# Patient Record
Sex: Male | Born: 1960 | Race: White | Hispanic: No | Smoking: Former smoker
Health system: Southern US, Community
[De-identification: ages and names within clinical notes are randomized; demographics above are authoritative.]

## PROBLEM LIST (undated history)

## (undated) DIAGNOSIS — M1990 Unspecified osteoarthritis, unspecified site: Secondary | ICD-10-CM

## (undated) DIAGNOSIS — K219 Gastro-esophageal reflux disease without esophagitis: Secondary | ICD-10-CM

## (undated) DIAGNOSIS — Z9889 Other specified postprocedural states: Secondary | ICD-10-CM

## (undated) DIAGNOSIS — R112 Nausea with vomiting, unspecified: Secondary | ICD-10-CM

## (undated) HISTORY — PX: TONSILLECTOMY: SUR1361

## (undated) HISTORY — PX: SHOULDER ARTHROSCOPY: SHX128

## (undated) HISTORY — PX: CERVICAL FUSION: SHX112

---

## 1991-01-13 HISTORY — PX: KNEE ARTHROSCOPY: SUR90

## 2000-02-15 ENCOUNTER — Emergency Department (HOSPITAL_COMMUNITY): Admission: EM | Admit: 2000-02-15 | Discharge: 2000-02-15 | Payer: Self-pay | Admitting: Emergency Medicine

## 2000-02-25 ENCOUNTER — Encounter: Payer: Self-pay | Admitting: Specialist

## 2000-02-25 ENCOUNTER — Ambulatory Visit (HOSPITAL_COMMUNITY): Admission: RE | Admit: 2000-02-25 | Discharge: 2000-02-25 | Payer: Self-pay | Admitting: Specialist

## 2000-04-05 ENCOUNTER — Ambulatory Visit (HOSPITAL_COMMUNITY): Admission: RE | Admit: 2000-04-05 | Discharge: 2000-04-05 | Payer: Self-pay | Admitting: Specialist

## 2000-04-05 ENCOUNTER — Encounter: Payer: Self-pay | Admitting: Specialist

## 2000-05-27 ENCOUNTER — Encounter: Payer: Self-pay | Admitting: Specialist

## 2000-05-27 ENCOUNTER — Ambulatory Visit (HOSPITAL_COMMUNITY): Admission: RE | Admit: 2000-05-27 | Discharge: 2000-05-27 | Payer: Self-pay | Admitting: Specialist

## 2002-04-28 ENCOUNTER — Encounter: Payer: Self-pay | Admitting: Family Medicine

## 2002-04-28 ENCOUNTER — Encounter: Admission: RE | Admit: 2002-04-28 | Discharge: 2002-04-28 | Payer: Self-pay | Admitting: Family Medicine

## 2002-08-15 ENCOUNTER — Encounter: Payer: Self-pay | Admitting: Orthopedic Surgery

## 2002-08-15 ENCOUNTER — Ambulatory Visit (HOSPITAL_COMMUNITY): Admission: RE | Admit: 2002-08-15 | Discharge: 2002-08-15 | Payer: Self-pay | Admitting: Orthopedic Surgery

## 2008-12-12 HISTORY — PX: MEDIAL COLLATERAL LIGAMENT REPAIR, ELBOW: SHX2018

## 2011-01-07 ENCOUNTER — Encounter (HOSPITAL_BASED_OUTPATIENT_CLINIC_OR_DEPARTMENT_OTHER): Payer: Self-pay | Admitting: *Deleted

## 2011-01-07 NOTE — Progress Notes (Signed)
To [facility] at 1100.Hg,Ekg on arrival ,npo after mn-to take nexium w/sip water that am.

## 2011-01-08 ENCOUNTER — Encounter (HOSPITAL_BASED_OUTPATIENT_CLINIC_OR_DEPARTMENT_OTHER): Payer: Self-pay | Admitting: Anesthesiology

## 2011-01-08 ENCOUNTER — Ambulatory Visit (HOSPITAL_BASED_OUTPATIENT_CLINIC_OR_DEPARTMENT_OTHER)
Admission: RE | Admit: 2011-01-08 | Discharge: 2011-01-08 | Disposition: A | Discharge status: Home / Self Care | Payer: BC Managed Care – PPO | Source: Ambulatory Visit | Attending: Orthopedic Surgery | Admitting: Orthopedic Surgery

## 2011-01-08 ENCOUNTER — Other Ambulatory Visit: Payer: Self-pay

## 2011-01-08 ENCOUNTER — Other Ambulatory Visit: Payer: Self-pay | Admitting: Orthopedic Surgery

## 2011-01-08 ENCOUNTER — Encounter (HOSPITAL_BASED_OUTPATIENT_CLINIC_OR_DEPARTMENT_OTHER)
Admission: RE | Disposition: A | Discharge status: Home / Self Care | Payer: Self-pay | Source: Ambulatory Visit | Attending: Orthopedic Surgery

## 2011-01-08 ENCOUNTER — Encounter (HOSPITAL_BASED_OUTPATIENT_CLINIC_OR_DEPARTMENT_OTHER): Payer: Self-pay | Admitting: *Deleted

## 2011-01-08 ENCOUNTER — Ambulatory Visit (HOSPITAL_BASED_OUTPATIENT_CLINIC_OR_DEPARTMENT_OTHER): Payer: BC Managed Care – PPO | Admitting: Anesthesiology

## 2011-01-08 DIAGNOSIS — K219 Gastro-esophageal reflux disease without esophagitis: Secondary | ICD-10-CM | POA: Insufficient documentation

## 2011-01-08 DIAGNOSIS — Z9889 Other specified postprocedural states: Secondary | ICD-10-CM

## 2011-01-08 DIAGNOSIS — M7710 Lateral epicondylitis, unspecified elbow: Secondary | ICD-10-CM | POA: Insufficient documentation

## 2011-01-08 DIAGNOSIS — Z79899 Other long term (current) drug therapy: Secondary | ICD-10-CM | POA: Insufficient documentation

## 2011-01-08 HISTORY — DX: Nausea with vomiting, unspecified: Z98.890

## 2011-01-08 HISTORY — PX: REPAIR EXTENSOR TENDON: SHX5382

## 2011-01-08 HISTORY — DX: Gastro-esophageal reflux disease without esophagitis: K21.9

## 2011-01-08 HISTORY — DX: Other specified postprocedural states: R11.2

## 2011-01-08 SURGERY — REPAIR, TENDON, EXTENSOR
Anesthesia: General | Site: Elbow | Laterality: Right | Wound class: Clean

## 2011-01-08 MED ORDER — POVIDONE-IODINE 7.5 % EX SOLN: Freq: Once | CUTANEOUS | Status: DC

## 2011-01-08 MED ORDER — FENTANYL CITRATE 0.05 MG/ML IJ SOLN
INTRAMUSCULAR | Status: DC | PRN
  Administered 2011-01-08: 25 ug via INTRAVENOUS
  Administered 2011-01-08: 50 ug via INTRAVENOUS
  Administered 2011-01-08: 25 ug via INTRAVENOUS
  Administered 2011-01-08: 50 ug via INTRAVENOUS
  Administered 2011-01-08 (×2): 25 ug via INTRAVENOUS

## 2011-01-08 MED ORDER — LIDOCAINE HCL 1 % IJ SOLN
INTRAMUSCULAR | Status: DC | PRN
  Administered 2011-01-08: 14:00:00 via INTRAMUSCULAR

## 2011-01-08 MED ORDER — FENTANYL CITRATE 0.05 MG/ML IJ SOLN
25.0000 ug | INTRAMUSCULAR | Status: DC | PRN
  Administered 2011-01-08 (×2): 25 ug via INTRAVENOUS

## 2011-01-08 MED ORDER — MIDAZOLAM HCL 5 MG/5ML IJ SOLN
INTRAMUSCULAR | Status: DC | PRN
  Administered 2011-01-08: 2 mg via INTRAVENOUS

## 2011-01-08 MED ORDER — METOCLOPRAMIDE HCL 5 MG/ML IJ SOLN
INTRAMUSCULAR | Status: DC | PRN
  Administered 2011-01-08: 10 mg via INTRAVENOUS

## 2011-01-08 MED ORDER — LACTATED RINGERS IV SOLN
INTRAVENOUS | Status: DC
  Administered 2011-01-08: 100 mL/h via INTRAVENOUS
  Administered 2011-01-08: 14:00:00 via INTRAVENOUS

## 2011-01-08 MED ORDER — PROMETHAZINE HCL 25 MG/ML IJ SOLN: 6.2500 mg | INTRAMUSCULAR | Status: DC | PRN

## 2011-01-08 MED ORDER — ONDANSETRON HCL 4 MG/2ML IJ SOLN
INTRAMUSCULAR | Status: DC | PRN
  Administered 2011-01-08: 4 mg via INTRAVENOUS

## 2011-01-08 MED ORDER — LACTATED RINGERS IV SOLN
INTRAVENOUS | Status: DC
Start: 2011-01-08 — End: 2011-01-08

## 2011-01-08 MED ORDER — DEXAMETHASONE SODIUM PHOSPHATE 4 MG/ML IJ SOLN
INTRAMUSCULAR | Status: DC | PRN
  Administered 2011-01-08: 10 mg via INTRAVENOUS

## 2011-01-08 MED ORDER — PROPOFOL 10 MG/ML IV EMUL
INTRAVENOUS | Status: DC | PRN
  Administered 2011-01-08: 20 mg via INTRAVENOUS
  Administered 2011-01-08 (×2): 10 mg via INTRAVENOUS
  Administered 2011-01-08 (×2): 20 mg via INTRAVENOUS
  Administered 2011-01-08: 200 mg via INTRAVENOUS
  Administered 2011-01-08: 20 mg via INTRAVENOUS
  Administered 2011-01-08: 50 mg via INTRAVENOUS

## 2011-01-08 MED ORDER — MEPERIDINE HCL 50 MG PO TABS
50.0000 mg | ORAL_TABLET | ORAL | Status: DC | PRN
  Administered 2011-01-08: 50 mg via ORAL

## 2011-01-08 MED ORDER — KETOROLAC TROMETHAMINE 30 MG/ML IJ SOLN
INTRAMUSCULAR | Status: DC | PRN
  Administered 2011-01-08: 30 mg via INTRAVENOUS

## 2011-01-08 MED ORDER — GLYCOPYRROLATE 0.2 MG/ML IJ SOLN
INTRAMUSCULAR | Status: DC | PRN
  Administered 2011-01-08: 0.2 mg via INTRAVENOUS

## 2011-01-08 MED ORDER — LIDOCAINE HCL (CARDIAC) 20 MG/ML IV SOLN
INTRAVENOUS | Status: DC | PRN
  Administered 2011-01-08: 100 mg via INTRAVENOUS

## 2011-01-08 MED ORDER — DIPHENHYDRAMINE HCL 50 MG/ML IJ SOLN
INTRAMUSCULAR | Status: DC | PRN
  Administered 2011-01-08: 6.25 mg via INTRAVENOUS

## 2011-01-08 SURGICAL SUPPLY — 63 items
BANDAGE BSB183 OLD 31 155 4 (GAUZE/BANDAGES/DRESSINGS) IMPLANT
BANDAGE COBAN STERILE 4 (GAUZE/BANDAGES/DRESSINGS) ×2 IMPLANT
BANDAGE CONFORM 3  STR LF (GAUZE/BANDAGES/DRESSINGS) IMPLANT
BANDAGE ELASTIC 3 VELCRO ST LF (GAUZE/BANDAGES/DRESSINGS) IMPLANT
BANDAGE ELASTIC 6 VELCRO ST LF (GAUZE/BANDAGES/DRESSINGS) ×2 IMPLANT
BANDAGE GAUZE ELAST BULKY 4 IN (GAUZE/BANDAGES/DRESSINGS) ×2 IMPLANT
BLADE SURG 15 STRL LF DISP TIS (BLADE) ×2 IMPLANT
BLADE SURG 15 STRL SS (BLADE) ×2
BNDG ESMARK 4X9 LF (GAUZE/BANDAGES/DRESSINGS) ×2 IMPLANT
CANISTER SUCTION 1200CC (MISCELLANEOUS) IMPLANT
CANISTER SUCTION 2500CC (MISCELLANEOUS) ×2 IMPLANT
CLOTH BEACON ORANGE TIMEOUT ST (SAFETY) ×2 IMPLANT
CORDS BIPOLAR (ELECTRODE) ×2 IMPLANT
COVER TABLE BACK 60X90 (DRAPES) ×2 IMPLANT
DRAPE EXTREMITY T 121X128X90 (DRAPE) ×2 IMPLANT
DRAPE LG THREE QUARTER DISP (DRAPES) ×6 IMPLANT
DRAPE U-SHAPE 47X51 STRL (DRAPES) ×2 IMPLANT
DRSG EMULSION OIL 3X3 NADH (GAUZE/BANDAGES/DRESSINGS) ×2 IMPLANT
DRSG PAD ABDOMINAL 8X10 ST (GAUZE/BANDAGES/DRESSINGS) ×4 IMPLANT
DURAPREP 26ML APPLICATOR (WOUND CARE) ×4 IMPLANT
ELECT REM PT RETURN 9FT ADLT (ELECTROSURGICAL) ×2
ELECTRODE REM PT RTRN 9FT ADLT (ELECTROSURGICAL) ×1 IMPLANT
GAUZE KERLIX 2  STERILE LF (GAUZE/BANDAGES/DRESSINGS) ×2 IMPLANT
GAUZE SPONGE 4X4 12PLY STRL LF (GAUZE/BANDAGES/DRESSINGS) IMPLANT
GLOVE ECLIPSE 8.0 STRL XLNG CF (GLOVE) ×2 IMPLANT
GLOVE INDICATOR 6.5 STRL GRN (GLOVE) ×2 IMPLANT
GOWN W/COTTON TOWEL STD LRG (GOWNS) ×2 IMPLANT
GOWN XL W/COTTON TOWEL STD (GOWNS) ×2 IMPLANT
NEEDLE 27GAX1X1/2 (NEEDLE) IMPLANT
NEEDLE HYPO 22GX1.5 SAFETY (NEEDLE) ×2 IMPLANT
NS IRRIG 500ML POUR BTL (IV SOLUTION) ×2 IMPLANT
PACK BASIN DAY SURGERY FS (CUSTOM PROCEDURE TRAY) ×2 IMPLANT
PADDING CAST ABS 4INX4YD NS (CAST SUPPLIES) ×1
PADDING CAST ABS COTTON 4X4 ST (CAST SUPPLIES) ×1 IMPLANT
PADDING CAST COTTON 6X4 STRL (CAST SUPPLIES) ×2 IMPLANT
PENCIL BUTTON HOLSTER BLD 10FT (ELECTRODE) ×2 IMPLANT
SLING ARM FOAM STRAP LRG (SOFTGOODS) ×2 IMPLANT
SPLINT FAST PLASTER 5X30 (CAST SUPPLIES) ×15
SPLINT PLASTER CAST FAST 5X30 (CAST SUPPLIES) ×15 IMPLANT
SPONGE GAUZE 2X2 8PLY STRL LF (GAUZE/BANDAGES/DRESSINGS) IMPLANT
SPONGE GAUZE 4X4 12PLY (GAUZE/BANDAGES/DRESSINGS) ×2 IMPLANT
STOCKINETTE 4X48 STRL (DRAPES) IMPLANT
STOCKINETTE IMPERVIOUS LG (DRAPES) ×2 IMPLANT
SUCTION FRAZIER TIP 10 FR DISP (SUCTIONS) ×2 IMPLANT
SUT ETHILON 4 0 PS 2 18 (SUTURE) ×4 IMPLANT
SUT ETHILON 5 0 PS 2 18 (SUTURE) IMPLANT
SUT VIC AB 0 CT2 27 (SUTURE) ×2 IMPLANT
SUT VIC AB 0 SH 27 (SUTURE) ×4 IMPLANT
SUT VIC AB 2-0 SH 27 (SUTURE)
SUT VIC AB 2-0 SH 27XBRD (SUTURE) IMPLANT
SUT VIC AB 2-0 UR6 27 (SUTURE) IMPLANT
SUT VIC AB 3-0 PS2 18 (SUTURE)
SUT VIC AB 3-0 PS2 18XBRD (SUTURE) IMPLANT
SUT VIC AB 3-0 SH 27 (SUTURE) ×1
SUT VIC AB 3-0 SH 27X BRD (SUTURE) ×1 IMPLANT
SUT VIC AB 4-0 SH 27 (SUTURE)
SUT VIC AB 4-0 SH 27XANBCTRL (SUTURE) IMPLANT
SYR BULB IRRIGATION 50ML (SYRINGE) ×2 IMPLANT
SYR CONTROL 10ML LL (SYRINGE) IMPLANT
TOWEL OR 17X24 6PK STRL BLUE (TOWEL DISPOSABLE) ×4 IMPLANT
TUBE CONNECTING 12X1/4 (SUCTIONS) IMPLANT
UNDERPAD 30X30 INCONTINENT (UNDERPADS AND DIAPERS) ×2 IMPLANT
WATER STERILE IRR 500ML POUR (IV SOLUTION) ×2 IMPLANT

## 2011-01-08 NOTE — Anesthesia Preprocedure Evaluation (Addendum)
Anesthesia Evaluation  Patient identified by MRN, date of birth, ID band Patient awake    Reviewed: Allergy & Precautions, H&P , NPO status , Patient's Chart, lab work & pertinent test results, reviewed documented beta blocker date and time   History of Anesthesia Complications (+) PONV  Airway Mallampati: II TM Distance: >3 FB Neck ROM: full    Dental No notable dental hx. (+) Teeth Intact and Dental Advisory Given   Pulmonary neg pulmonary ROS,  clear to auscultation  Pulmonary exam normal       Cardiovascular Exercise Tolerance: Good neg cardio ROS regular Normal    Neuro/Psych Negative Neurological ROS  Negative Psych ROS   GI/Hepatic negative GI ROS, Neg liver ROS, GERD-  Medicated and Controlled,  Endo/Other  Negative Endocrine ROS  Renal/GU negative Renal ROS  Genitourinary negative   Musculoskeletal   Abdominal   Peds  Hematology negative hematology ROS (+)   Anesthesia Other Findings   Reproductive/Obstetrics negative OB ROS                          Anesthesia Physical Anesthesia Plan  ASA: II  Anesthesia Plan: General   Post-op Pain Management:    Induction: Intravenous  Airway Management Planned: LMA  Additional Equipment:   Intra-op Plan:   Post-operative Plan:   Informed Consent: I have reviewed the patients History and Physical, chart, labs and discussed the procedure including the risks, benefits and alternatives for the proposed anesthesia with the patient or authorized representative who has indicated his/her understanding and acceptance.   Dental Advisory Given  Plan Discussed with: CRNA and Surgeon  Anesthesia Plan Comments:         Anesthesia Quick Evaluation

## 2011-01-08 NOTE — Anesthesia Postprocedure Evaluation (Signed)
  Anesthesia Post-op Note  Patient: Derrick Martinez  Procedure(s) Performed:  REPAIR EXTENSOR TENDON - RECONSTRUCTION OF COMMON EXTENSOR TENDON WITH PARTIAL EPICONDYLECTOMY OF RIGHT ELBOW  Patient Location: PACU  Anesthesia Type: General  Level of Consciousness: oriented and sedated  Airway and Oxygen Therapy: Patient Spontanous Breathing  Post-op Pain: mild  Post-op Assessment: Post-op Vital signs reviewed, Patient's Cardiovascular Status Stable, Respiratory Function Stable and Patent Airway  Post-op Vital Signs: stable  Complications: No apparent anesthesia complications

## 2011-01-08 NOTE — Brief Op Note (Signed)
01/08/2011  2:33 PM  PATIENT:  Derrick Martinez  50 y.o. male  PRE-OPERATIVE DIAGNOSIS:  CHRONIC LATERAL EPICONDYLITIS RT ELBOW  POST-OPERATIVE DIAGNOSIS:  CHRONIC LATERAL EPICONDYLITIS RT ELBOW  PROCEDURE:  Procedure(s): REPAIR EXTENSOR TENDON with partial lateral epicondylectomy  SURGEON:  Surgeon(s): Ivor Kishi P Agam Davenport  PHYSICIAN ASSISTANT: none  ASSISTANTS: nurse  ANESTHESIA:   general  EBL:  Total I/O In: 1000 [I.V.:1000] Out: -   BLOOD ADMINISTERED:none  DRAINS: none   LOCAL MEDICATIONS USED:  MARCAINE *8**CC  SPECIMEN:  Source of Specimen:  granulation tissue from common extensor tendon  DISPOSITION OF SPECIMEN:  PATHOLOGY  COUNTS:  YES  TOURNIQUET:   Total Tourniquet Time Documented: Upper Arm (Right) - 65 minutes  DICTATION: .Other Dictation: Dictation Number [id number]  PLAN OF CARE: Discharge to home after PACU  PATIENT DISPOSITION:  PACU - hemodynamically stable.

## 2011-01-08 NOTE — H&P (Signed)
Derrick Martinez is an 50 y.o. male.   Chief Complaint: painful rt elbow HPI: chronic pain and tenderness over lateral epicondyle unresponsive to non-surgical treatment  Past Medical History  Diagnosis Date  . GERD (gastroesophageal reflux disease)     mild takes nexium every other day  . PONV (postoperative nausea and vomiting)     after knee surgery in '93    Past Surgical History  Procedure Date  . Medial collateral ligament repair, elbow 12/10  . Shoulder arthroscopy   . Knee arthroscopy 1993    History reviewed. No pertinent family history. Social History:  reports that he has quit smoking. His smoking use included Cigars. He does not have any smokeless tobacco history on file. He reports that he drinks alcohol. His drug history not on file.  Allergies: No Known Allergies  Medications Prior to Admission  Medication Dose Route Frequency Provider Last Rate Last Dose  . lactated ringers infusion   Intravenous Continuous Gaetano Hawthorne, MD 100 mL/hr at 01/08/11 1150 100 mL/hr at 01/08/11 1150  . povidone-iodine (BETADINE) 7.5 % scrub   Topical Once James P Aplington       Medications Prior to Admission  Medication Sig Dispense Refill  . diclofenac (VOLTAREN) 25 MG EC tablet Take 25 mg by mouth 2 (two) times daily. As needed       . esomeprazole (NEXIUM) 20 MG capsule Take 20 mg by mouth daily before breakfast. Takes every other day         Results for orders placed during the hospital encounter of 01/08/11 (from the past 48 hour(s))  POCT HEMOGLOBIN-HEMACUE     Status: Normal   Collection Time   01/08/11 11:55 AM      Component Value Range Comment   Hemoglobin 14.5  13.0 - 17.0 (g/dL)    No results found.  ROS  Blood pressure 125/74, pulse 75, temperature 96.9 F (36.1 C), temperature source Oral, resp. rate 18, height 6' (1.829 m), weight 90.719 kg (200 lb), SpO2 100.00%. Physical Exam  Constitutional: He is oriented to person, place, and time. He appears  well-developed and well-nourished.  HENT:  Head: Normocephalic and atraumatic.  Eyes: Conjunctivae and EOM are normal. Pupils are equal, round, and reactive to light.  Neck: Normal range of motion. Neck supple.  Cardiovascular: Normal rate, regular rhythm, normal heart sounds and intact distal pulses.   Respiratory: Effort normal and breath sounds normal.  GI: Soft. Bowel sounds are normal.  Musculoskeletal: He exhibits tenderness.       Tender lateral epicondyle rt elbow  Neurological: He is alert and oriented to person, place, and time. He has normal reflexes.  Skin: Skin is dry.  Psychiatric: He has a normal mood and affect. His behavior is normal. Judgment and thought content normal.     Assessment/Plan Chronic lateral epicondylitis rt elbow Repair common extensor tendon with partial lateral epicondylectomy rt elbow  APLINGTON,JAMES P 01/08/2011, 12:40 PM

## 2011-01-08 NOTE — Transfer of Care (Signed)
Immediate Anesthesia Transfer of Care Note  Patient: Derrick Martinez  Procedure(s) Performed:  REPAIR EXTENSOR TENDON - RECONSTRUCTION OF COMMON EXTENSOR TENDON WITH PARTIAL EPICONDYLECTOMY OF RIGHT ELBOW  Patient Location: PACU  Anesthesia Type: General  Level of Consciousness: awake, alert  and oriented  Airway & Oxygen Therapy: Patient Spontanous Breathing and Patient connected to face mask oxygen  Post-op Assessment: Report given to PACU RN and Post -op Vital signs reviewed and stable  Post vital signs: Reviewed and stable  Complications: No apparent anesthesia complications

## 2011-01-08 NOTE — Anesthesia Postprocedure Evaluation (Signed)
  Anesthesia Post-op Note  Patient: Derrick Martinez  Procedure(s) Performed:  REPAIR EXTENSOR TENDON - RECONSTRUCTION OF COMMON EXTENSOR TENDON WITH PARTIAL EPICONDYLECTOMY OF RIGHT ELBOW  Patient Location: PACU  Anesthesia Type: General  Level of Consciousness: oriented and sedated  Airway and Oxygen Therapy: Patient Spontanous Breathing  Post-op Pain: mild  Post-op Assessment: Post-op Vital signs reviewed, Patient's Cardiovascular Status Stable, Respiratory Function Stable and Patent Airway  Post-op Vital Signs: stable  Complications: No apparent anesthesia complications 

## 2011-01-08 NOTE — Anesthesia Procedure Notes (Signed)
Procedure Name: LMA Insertion Date/Time: 01/08/2011 12:57 PM Performed by: Huel Coventry Pre-anesthesia Checklist: Patient identified, Emergency Drugs available, Suction available and Patient being monitored Patient Re-evaluated:Patient Re-evaluated prior to inductionOxygen Delivery Method: Circle System Utilized Preoxygenation: Pre-oxygenation with 100% oxygen Intubation Type: IV induction Ventilation: Mask ventilation without difficulty LMA: LMA with gastric port inserted LMA Size: 5.0 Number of attempts: 1 Placement Confirmation: positive ETCO2 Tube secured with: Tape Dental Injury: Teeth and Oropharynx as per pre-operative assessment

## 2011-01-09 ENCOUNTER — Encounter (HOSPITAL_BASED_OUTPATIENT_CLINIC_OR_DEPARTMENT_OTHER): Payer: Self-pay | Admitting: Orthopedic Surgery

## 2011-01-09 NOTE — Op Note (Signed)
NAMEMURLIN, SCHRIEBER NO.:  [id number]  MEDICAL RECORD NO.:  [id number]  LOCATION:                               FACILITY:  [facility]  PHYSICIAN:  Marlowe Kays, M.D.  DATE OF BIRTH:  [birth year]  DATE OF PROCEDURE:  01/08/2011 DATE OF DISCHARGE:                              OPERATIVE REPORT   PREOPERATIVE DIAGNOSIS:  Chronic lateral epicondylitis, right elbow.  POSTOPERATIVE DIAGNOSIS:  Chronic lateral epicondylitis, right elbow.  OPERATION:  Reconstruction of common extensor tendon lateral right elbow with partial lateral epicondylectomy.  SURGEON:  Marlowe Kays, MD  ASSISTANT:  Nurse.  ANESTHESIA:  General.  PATHOLOGY AND JUSTIFICATION FOR PROCEDURE:  Chronic pain and tenderness over the lateral epicondyle of his right elbow, refractory to nonsurgical treatment.  PROCEDURE IN DETAIL:  Satisfied general anesthesia, pneumatic tourniquet, right upper extremity was prepped with DuraPrep from wrist to tourniquet, and draped as sterile field.  Tourniquet was inflated to 250 mmHg.  Time-out was performed.  I marked out incision midway between the lateral epicondyle and the olecranon and worked my way through the very thin fascia down to the common extensor tendon.  I outlined with a marking pin a horizontal U-type incision site to detach the common extensor tendon off the lateral epicondyle which I did with a combination of cautery and sharp dissection.  There was a typical appearance to the undersurface of the common extensor tendon with a glassy look to the tendon which was partially detached.  I detached the tendon all way down into the joint which appeared normal on inspection other than perhaps a little more joint fluid than I would have expected. I then debrided out the underneath surface of the common extensor tendon getting back to healthy tendon and sent the debridement portion of tissue to Pathology.  I then performed a partial lateral  epicondylectomy getting down to raw bone and on proximal end, made 2 parallel drill holes through the cut bone and then at the base of the residual epicondyle superiorly into these 2 holes, so that they connected.  We then took a 0-Vicryl suture through the top up of 2 holes, working from proximal and exiting out the cut bone surface winding it through the common extensor tendon and then back out through the bottom 2 holes. While the tendon was held in a snug position, I then re-approximated the sides of the U with additional 0 Vicryl after first irrigating the soft tissues with sterile saline and infiltrated them with 0.5% plain Marcaine.  At the most proximal end, I tied the suture that was in the bone and completed the reattachment of the tendon.  I  then closed the thin fascia with interrupted 3-0 Vicryl and interrupted 4-0 nylon mattress sutures in the skin and subcutaneous tissue.  Betadine, Adaptic, dry sterile dressing were applied followed by a long-arm splint cast which was well padded.  Tourniquet was released in the process, slightly over 1 hour of tourniquet time had been elapsed.  At the time of this dictation was on his way to Recovery in satisfactory condition with no known complications and essentially no blood loss.          ______________________________ Marlowe Kays,  M.D.     JA/MEDQ  D:  01/08/2011  T:  01/09/2011  Job:  [id number]

## 2011-10-16 ENCOUNTER — Other Ambulatory Visit: Payer: Self-pay | Admitting: Endocrinology

## 2011-10-16 DIAGNOSIS — E0590 Thyrotoxicosis, unspecified without thyrotoxic crisis or storm: Secondary | ICD-10-CM

## 2011-10-29 ENCOUNTER — Encounter (HOSPITAL_COMMUNITY)
Admission: RE | Admit: 2011-10-29 | Discharge: 2011-10-29 | Disposition: A | Discharge status: Home / Self Care | Payer: BC Managed Care – PPO | Source: Ambulatory Visit | Attending: Endocrinology | Admitting: Endocrinology

## 2011-10-29 DIAGNOSIS — E0590 Thyrotoxicosis, unspecified without thyrotoxic crisis or storm: Secondary | ICD-10-CM | POA: Insufficient documentation

## 2011-10-30 ENCOUNTER — Encounter (HOSPITAL_COMMUNITY)
Admission: RE | Admit: 2011-10-30 | Discharge: 2011-10-30 | Payer: BC Managed Care – PPO | Source: Ambulatory Visit | Attending: Endocrinology | Admitting: Endocrinology

## 2011-10-30 MED ORDER — SODIUM IODIDE I 131 CAPSULE
0.0100 | Freq: Once | INTRAVENOUS | Status: AC | PRN
  Administered 2011-10-30: 0.01 via ORAL

## 2011-11-26 ENCOUNTER — Other Ambulatory Visit (HOSPITAL_COMMUNITY): Payer: Self-pay

## 2012-08-23 ENCOUNTER — Other Ambulatory Visit: Payer: Self-pay | Admitting: Dermatology

## 2013-03-19 ENCOUNTER — Encounter (HOSPITAL_COMMUNITY): Payer: Self-pay | Admitting: Emergency Medicine

## 2013-03-19 ENCOUNTER — Emergency Department (HOSPITAL_COMMUNITY): Payer: BC Managed Care – PPO

## 2013-03-19 ENCOUNTER — Emergency Department (HOSPITAL_COMMUNITY)
Admission: EM | Admit: 2013-03-19 | Discharge: 2013-03-19 | Disposition: A | Discharge status: Home / Self Care | Payer: BC Managed Care – PPO | Source: Home / Self Care | Attending: Emergency Medicine | Admitting: Emergency Medicine

## 2013-03-19 ENCOUNTER — Emergency Department (HOSPITAL_COMMUNITY)
Admission: EM | Admit: 2013-03-19 | Discharge: 2013-03-19 | Disposition: A | Discharge status: Home / Self Care | Payer: BC Managed Care – PPO | Attending: Emergency Medicine | Admitting: Emergency Medicine

## 2013-03-19 DIAGNOSIS — M542 Cervicalgia: Secondary | ICD-10-CM

## 2013-03-19 DIAGNOSIS — S01309A Unspecified open wound of unspecified ear, initial encounter: Secondary | ICD-10-CM | POA: Insufficient documentation

## 2013-03-19 DIAGNOSIS — K219 Gastro-esophageal reflux disease without esophagitis: Secondary | ICD-10-CM | POA: Insufficient documentation

## 2013-03-19 DIAGNOSIS — Z79899 Other long term (current) drug therapy: Secondary | ICD-10-CM

## 2013-03-19 DIAGNOSIS — Y92410 Unspecified street and highway as the place of occurrence of the external cause: Secondary | ICD-10-CM

## 2013-03-19 DIAGNOSIS — Z23 Encounter for immunization: Secondary | ICD-10-CM | POA: Insufficient documentation

## 2013-03-19 DIAGNOSIS — S01319A Laceration without foreign body of unspecified ear, initial encounter: Secondary | ICD-10-CM

## 2013-03-19 DIAGNOSIS — Z87891 Personal history of nicotine dependence: Secondary | ICD-10-CM | POA: Insufficient documentation

## 2013-03-19 DIAGNOSIS — S01311A Laceration without foreign body of right ear, initial encounter: Secondary | ICD-10-CM

## 2013-03-19 DIAGNOSIS — Y9389 Activity, other specified: Secondary | ICD-10-CM | POA: Insufficient documentation

## 2013-03-19 DIAGNOSIS — S0990XA Unspecified injury of head, initial encounter: Secondary | ICD-10-CM | POA: Insufficient documentation

## 2013-03-19 MED ORDER — ONDANSETRON HCL 4 MG/2ML IJ SOLN
4.0000 mg | Freq: Once | INTRAMUSCULAR | Status: DC
  Filled 2013-03-19: qty 2

## 2013-03-19 MED ORDER — ONDANSETRON 4 MG PO TBDP
4.0000 mg | ORAL_TABLET | Freq: Once | ORAL | Status: AC
  Administered 2013-03-19: 4 mg via ORAL

## 2013-03-19 MED ORDER — CEPHALEXIN 250 MG PO CAPS
500.0000 mg | ORAL_CAPSULE | Freq: Once | ORAL | Status: AC
  Administered 2013-03-19: 500 mg via ORAL
  Filled 2013-03-19: qty 2

## 2013-03-19 MED ORDER — HYDROCODONE-ACETAMINOPHEN 5-325 MG PO TABS
2.0000 | ORAL_TABLET | Freq: Once | ORAL | Status: AC
  Administered 2013-03-19: 2 via ORAL
  Filled 2013-03-19: qty 2

## 2013-03-19 MED ORDER — AMOXICILLIN-POT CLAVULANATE 875-125 MG PO TABS: 1.0000 | ORAL_TABLET | Freq: Two times a day (BID) | ORAL | Status: DC

## 2013-03-19 MED ORDER — MORPHINE SULFATE 4 MG/ML IJ SOLN
4.0000 mg | Freq: Once | INTRAMUSCULAR | Status: DC
  Filled 2013-03-19: qty 1

## 2013-03-19 MED ORDER — TETANUS-DIPHTH-ACELL PERTUSSIS 5-2.5-18.5 LF-MCG/0.5 IM SUSP
0.5000 mL | Freq: Once | INTRAMUSCULAR | Status: AC
  Administered 2013-03-19: 0.5 mL via INTRAMUSCULAR
  Filled 2013-03-19: qty 0.5

## 2013-03-19 MED ORDER — ONDANSETRON 4 MG PO TBDP
ORAL_TABLET | ORAL | Status: AC
  Filled 2013-03-19: qty 1

## 2013-03-19 MED ORDER — HYDROCODONE-ACETAMINOPHEN 5-325 MG PO TABS: 2.0000 | ORAL_TABLET | ORAL | Status: DC | PRN
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
## 2013-03-19 NOTE — ED Notes (Signed)
Wound care, gave pt some 4x4, right ear continues to bleed
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
## 2013-03-19 NOTE — ED Notes (Signed)
Suture cart at the bedside.  

## 2013-03-19 NOTE — ED Notes (Signed)
Pt refuses pain medicine at this time  

## 2013-03-19 NOTE — ED Notes (Signed)
MD removed hard collar, pt denies tenderness to neck

## 2013-03-19 NOTE — Discharge Instructions (Signed)

## 2013-03-19 NOTE — ED Notes (Signed)
C-collar placed on patient.
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
## 2013-03-19 NOTE — ED Notes (Signed)
NS gauge applied to right ear, pt was getting nausea, MD aware orders given. Report given to [facility], Pt leaving with Family POV
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
## 2013-03-19 NOTE — ED Notes (Signed)
ENT at bedside
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
## 2013-03-19 NOTE — ED Notes (Signed)
Dr. Colbert CoyerArrieta at bedside, orders for pain med.  Pt declines at this time.  Nurse advised when pain med needed let nurse know.  Drip pad underneath right ear as lac is oozing blood.

## 2013-03-19 NOTE — Discharge Instructions (Signed)
Go directly to [facility] to be seen by Dr. Suszanne Connerseoh
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
## 2013-05-30 ENCOUNTER — Ambulatory Visit: Payer: BC Managed Care – PPO

## 2013-06-12 ENCOUNTER — Ambulatory Visit: Payer: BC Managed Care – PPO | Attending: Neurosurgery

## 2013-07-10 ENCOUNTER — Ambulatory Visit: Payer: BC Managed Care – PPO

## 2013-07-19 ENCOUNTER — Ambulatory Visit: Payer: BC Managed Care – PPO | Attending: Neurosurgery

## 2013-07-19 DIAGNOSIS — M2560 Stiffness of unspecified joint, not elsewhere classified: Secondary | ICD-10-CM | POA: Diagnosis not present

## 2013-07-19 DIAGNOSIS — Z981 Arthrodesis status: Secondary | ICD-10-CM | POA: Diagnosis not present

## 2013-07-19 DIAGNOSIS — M542 Cervicalgia: Secondary | ICD-10-CM | POA: Diagnosis not present

## 2013-07-19 DIAGNOSIS — IMO0001 Reserved for inherently not codable concepts without codable children: Secondary | ICD-10-CM | POA: Diagnosis present

## 2013-07-27 ENCOUNTER — Encounter: Payer: BC Managed Care – PPO | Admitting: Physical Therapy

## 2013-08-01 ENCOUNTER — Ambulatory Visit: Payer: BC Managed Care – PPO | Admitting: Physical Therapy

## 2013-08-03 ENCOUNTER — Ambulatory Visit: Payer: BC Managed Care – PPO | Admitting: Physical Therapy

## 2013-08-03 DIAGNOSIS — IMO0001 Reserved for inherently not codable concepts without codable children: Secondary | ICD-10-CM | POA: Diagnosis not present

## 2013-08-24 ENCOUNTER — Encounter: Payer: BC Managed Care – PPO | Admitting: Physical Therapy

## 2013-08-30 ENCOUNTER — Encounter: Payer: BC Managed Care – PPO | Admitting: Physical Therapy
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
## 2015-05-30 ENCOUNTER — Other Ambulatory Visit: Payer: Self-pay | Admitting: Gastroenterology

## 2015-05-30 DIAGNOSIS — R1011 Right upper quadrant pain: Secondary | ICD-10-CM

## 2015-05-30 DIAGNOSIS — R1032 Left lower quadrant pain: Secondary | ICD-10-CM

## 2015-06-07 ENCOUNTER — Ambulatory Visit
Admission: RE | Admit: 2015-06-07 | Discharge: 2015-06-07 | Disposition: A | Discharge status: Home / Self Care | Payer: BLUE CROSS/BLUE SHIELD | Source: Ambulatory Visit | Attending: Gastroenterology | Admitting: Gastroenterology

## 2015-06-07 DIAGNOSIS — R1011 Right upper quadrant pain: Secondary | ICD-10-CM

## 2015-06-07 DIAGNOSIS — R1032 Left lower quadrant pain: Secondary | ICD-10-CM

## 2015-06-12 ENCOUNTER — Other Ambulatory Visit (HOSPITAL_COMMUNITY): Payer: Self-pay | Admitting: Gastroenterology

## 2015-06-12 DIAGNOSIS — R1032 Left lower quadrant pain: Secondary | ICD-10-CM

## 2015-06-12 DIAGNOSIS — R1011 Right upper quadrant pain: Secondary | ICD-10-CM

## 2015-06-20 ENCOUNTER — Encounter (HOSPITAL_COMMUNITY): Payer: BLUE CROSS/BLUE SHIELD

## 2016-06-29 ENCOUNTER — Ambulatory Visit (HOSPITAL_COMMUNITY)
Admission: EM | Admit: 2016-06-29 | Discharge: 2016-06-29 | Disposition: A | Discharge status: Home / Self Care | Payer: BLUE CROSS/BLUE SHIELD | Attending: Family Medicine | Admitting: Family Medicine

## 2016-06-29 ENCOUNTER — Encounter (HOSPITAL_COMMUNITY): Payer: Self-pay | Admitting: *Deleted

## 2016-06-29 DIAGNOSIS — L03115 Cellulitis of right lower limb: Secondary | ICD-10-CM

## 2016-06-29 MED ORDER — DOXYCYCLINE HYCLATE 100 MG PO TABS: 100.0000 mg | ORAL_TABLET | Freq: Two times a day (BID) | ORAL | 0 refills | Status: DC

## 2016-06-29 NOTE — ED Triage Notes (Signed)
inj    r   Foot   4  Days     Ago       Pt   Stepped       In an oyster  Bed    Pt  Has  A  Break  In  Skin     Bleeding  Subsided       Pain    r    Foot    Getting  Better

## 2016-06-29 NOTE — ED Provider Notes (Addendum)
[facility]    CSN: [id number] Arrival date & time: 06/29/16  1300     History   Chief Complaint Chief Complaint  Patient presents with  . Foot Pain    HPI Derrick Martinez is a 56 y.o. male.   inj    r   Foot   4  Days     Ago       Pt   Stepped       In an oyster  Bed    Pt  Has  A  Break  In  Skin     Bleeding  Subsided       Pain    r    Foot    Getting  Better       This is a 56 year old man is [place] and suffered a laceration to the right foot, lateral aspect of the sole. He washed the laceration site out very well at the onset and seemed to be doing well until today when he noticed that the wound had opened, he was having more swelling, and the pain seemed to increase a little bit.  Last tetanus shot was 03/19/2013      Past Medical History:  Diagnosis Date  . GERD (gastroesophageal reflux disease)    mild takes nexium every other day  . PONV (postoperative nausea and vomiting)    after knee surgery in '93    There are no active problems to display for this patient.   Past Surgical History:  Procedure Laterality Date  . CERVICAL FUSION    . KNEE ARTHROSCOPY  1993  . MEDIAL COLLATERAL LIGAMENT REPAIR, ELBOW  12/10  . REPAIR EXTENSOR TENDON  01/08/2011   Procedure: REPAIR EXTENSOR TENDON;  Surgeon: Illene LabradorJames P Aplington;  Location: [facility];  Service: Orthopedics;  Laterality: Right;  RECONSTRUCTION OF COMMON EXTENSOR TENDON WITH PARTIAL EPICONDYLECTOMY OF RIGHT ELBOW  . SHOULDER ARTHROSCOPY         Home Medications    Prior to Admission medications   Medication Sig Start Date End Date Taking? Authorizing Provider  CIALIS 20 MG tablet Take 20 mg by mouth daily as needed. 03/02/13   [provider]  doxycycline (VIBRA-TABS) 100 MG tablet Take 1 tablet (100 mg total) by mouth 2 (two) times daily. 06/29/16   Elvina SidleLauenstein, Breylon Sherrow, MD  esomeprazole (NEXIUM) 20 MG capsule Take 20 mg  by mouth daily before breakfast. Takes every other day     [provider]    Family History Family History  Problem Relation Age of Onset  . Diabetes Father     Social History Social History  Substance Use Topics  . Smoking status: Former Smoker    Types: Cigars  . Smokeless tobacco: Never Used  . Alcohol use Yes     Comment: occasionally     Allergies   Percocet [oxycodone-acetaminophen]   Review of Systems Review of Systems  Skin: Positive for wound.  All other systems reviewed and are negative.    Physical Exam Triage Vital Signs ED Triage Vitals  Enc Vitals Group     BP 06/29/16 1313 130/76     Pulse Rate 06/29/16 1313 78     Resp 06/29/16 1313 18     Temp 06/29/16 1313 98.4 F (36.9 C)     Temp Source 06/29/16 1313 Oral     SpO2 06/29/16 1313 100 %     Weight --  Height --      Head Circumference --      Peak Flow --      Pain Score 06/29/16 1312 2     Pain Loc --      Pain Edu? --      Excl. in GC? --    No data found.   Updated Vital Signs BP 130/76 (BP Location: Right Arm)   Pulse 78   Temp 98.4 F (36.9 C) (Oral)   Resp 18   SpO2 100%    Physical Exam  Constitutional: He is oriented to person, place, and time. He appears well-developed and well-nourished.  HENT:  Head: Normocephalic.  Right Ear: External ear normal.  Left Ear: External ear normal.  Mouth/Throat: Oropharynx is clear and moist.  Eyes: Conjunctivae are normal. Pupils are equal, round, and reactive to light.  Neck: Normal range of motion.  Pulmonary/Chest: Effort normal.  Musculoskeletal: Normal range of motion.  Neurological: He is alert and oriented to person, place, and time.  Skin: Skin is warm and dry. There is erythema.  Mild swelling, laterally, right foot, sole with well approximated laceration and no drainage  Nursing note and vitals reviewed.    UC Treatments / Results  Labs (all labs ordered are listed, but only abnormal results are  displayed) Labs Reviewed - No data to display  EKG  EKG Interpretation None       Radiology No results found.  Procedures Procedures (including critical care time)  Medications Ordered in UC Medications - No data to display   Initial Impression / Assessment and Plan / UC Course  I have reviewed the triage vital signs and the nursing notes.  Pertinent labs & imaging results that were available during my care of the patient were reviewed by me and considered in my medical decision making (see chart for details).     Final Clinical Impressions(s) / UC Diagnoses   Final diagnoses:  Cellulitis of right lower extremity    New Prescriptions New Prescriptions   DOXYCYCLINE (VIBRA-TABS) 100 MG TABLET    Take 1 tablet (100 mg total) by mouth 2 (two) times daily.     Elvina Sidle, MD 06/29/16 1339    Elvina Sidle, MD 06/29/16 1339

## 2017-02-22 IMAGING — US US ABDOMEN COMPLETE
1 series · 14 of 25 positions shown · non-contrast
Comparison: CT 04/30/2015

CLINICAL DATA: Right upper quadrant pain and left lower quadrant
pain.

EXAM:
ABDOMEN ULTRASOUND COMPLETE

[Series 1: us abdomen complete · 0.32mm/px · 14 of 90 slices shown]
[im 1/90]
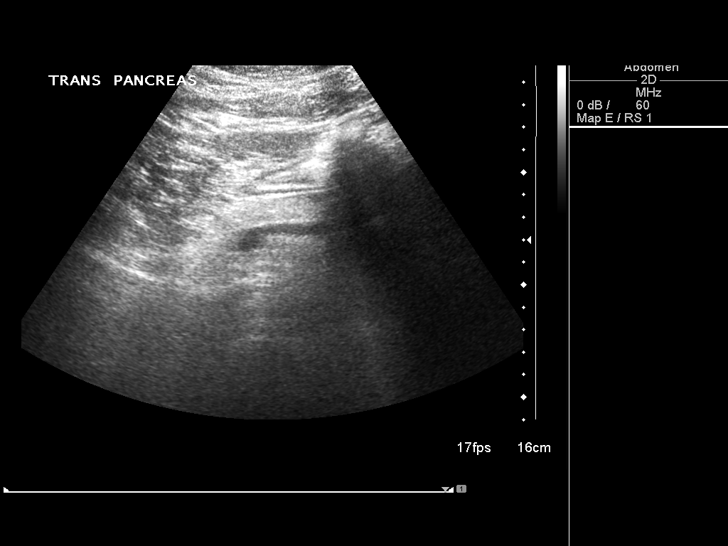
[im 8/90]
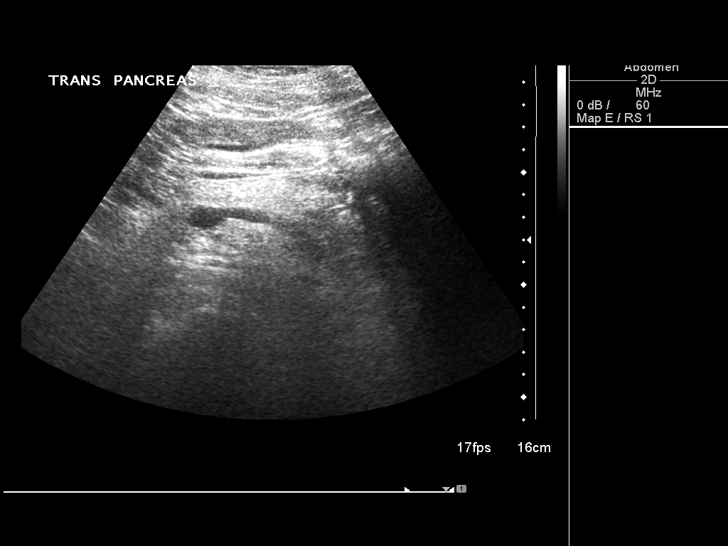
[im 15/90]
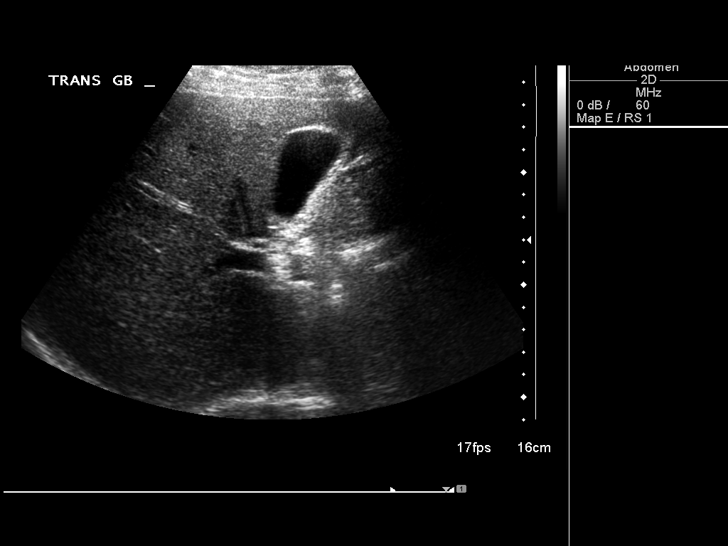
[im 23/90]
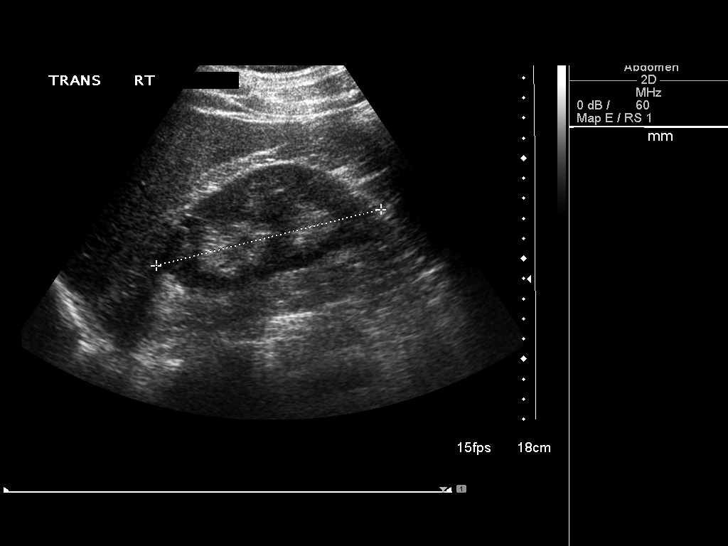
[im 30/90]
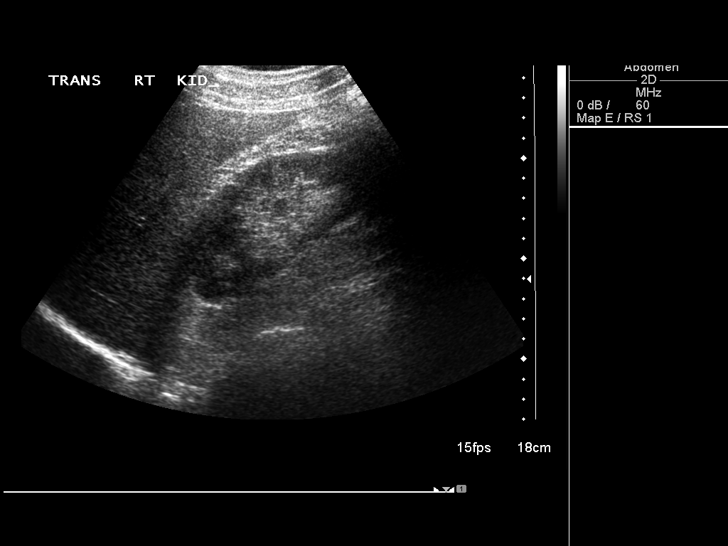
[im 34/90]
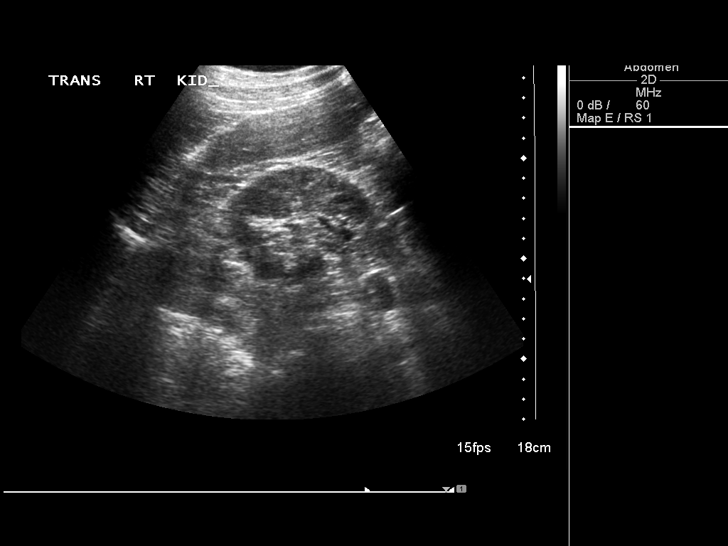
[im 41/90]
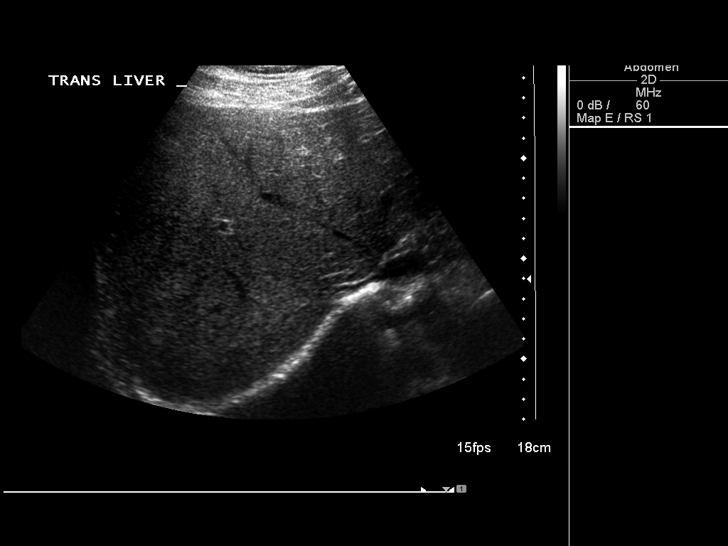
[im 49/90]
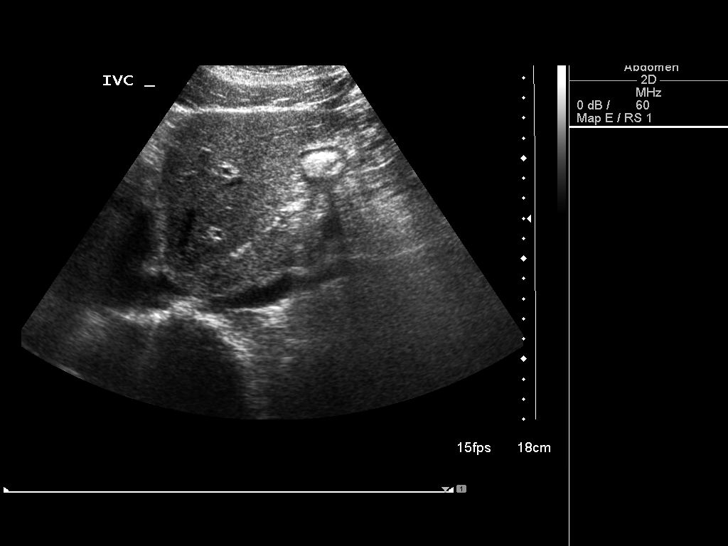
[im 56/90]
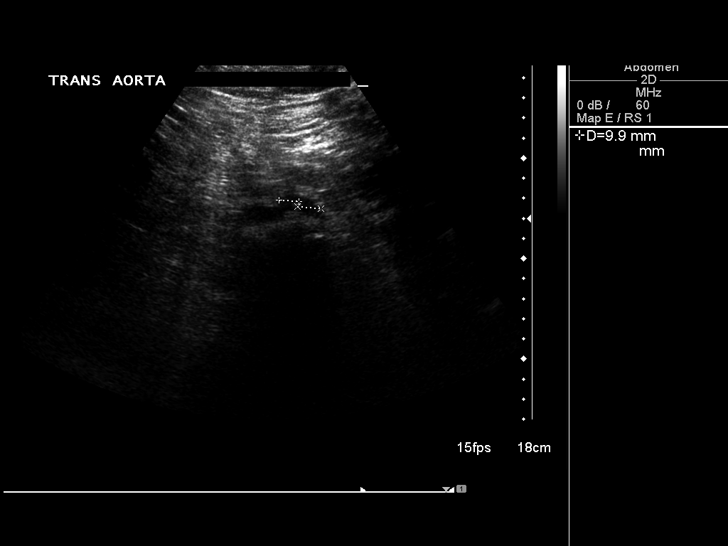
[im 60/90]
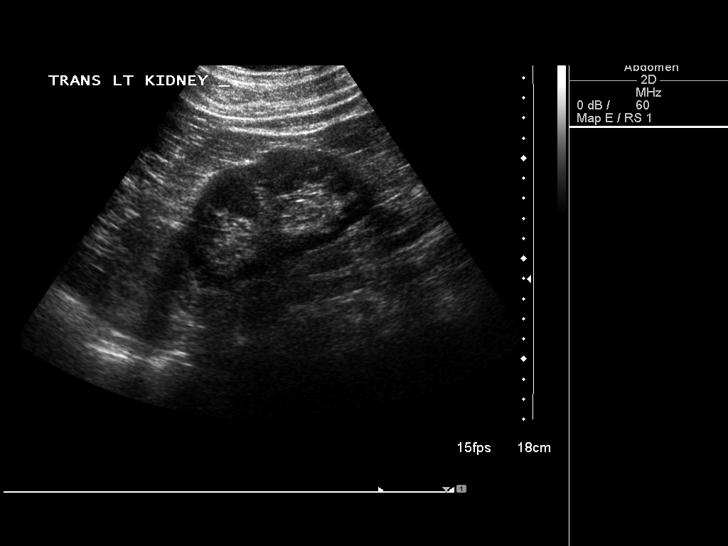
[im 67/90]
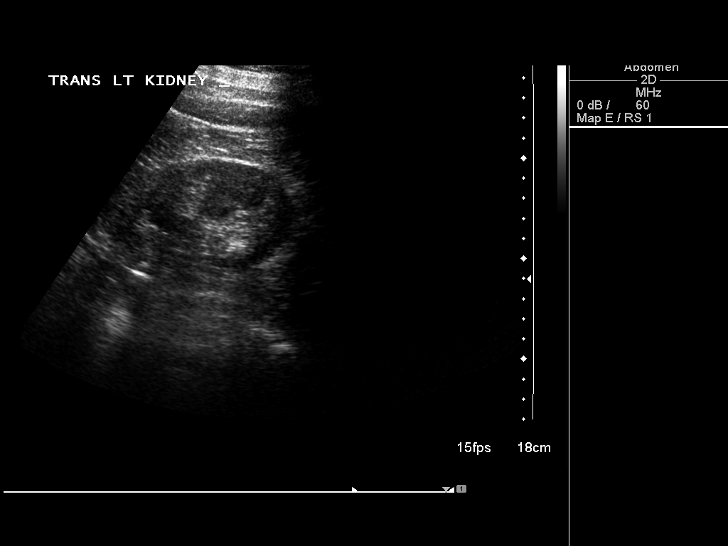
[im 75/90]
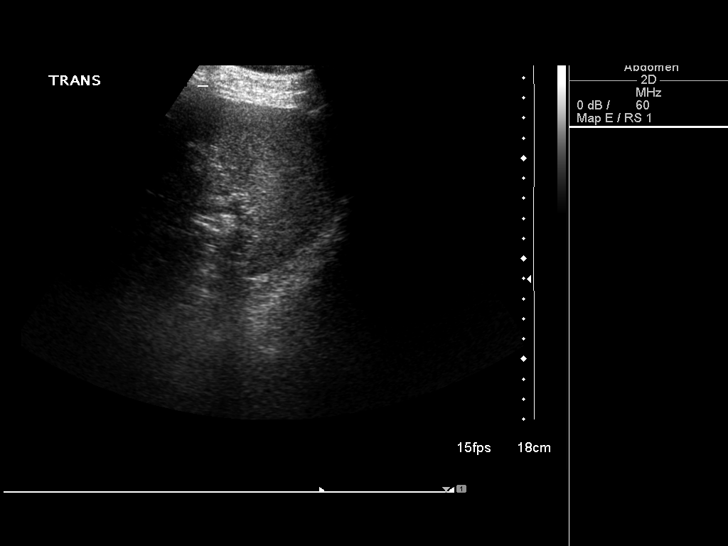
[im 82/90]
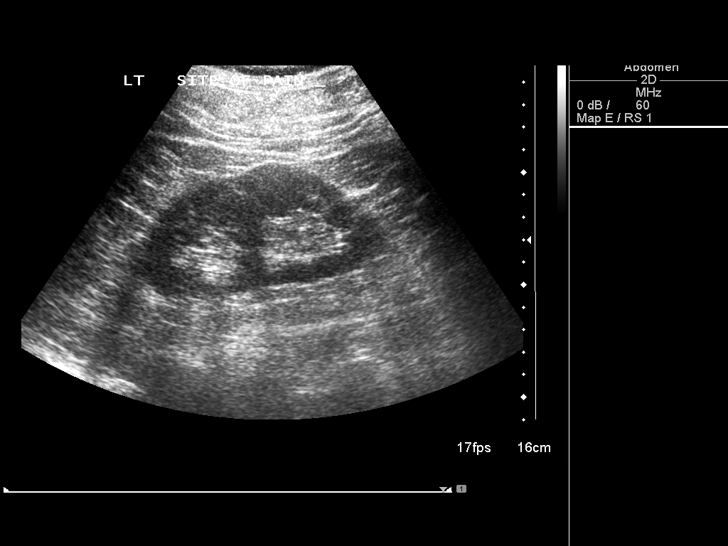
[im 90/90]
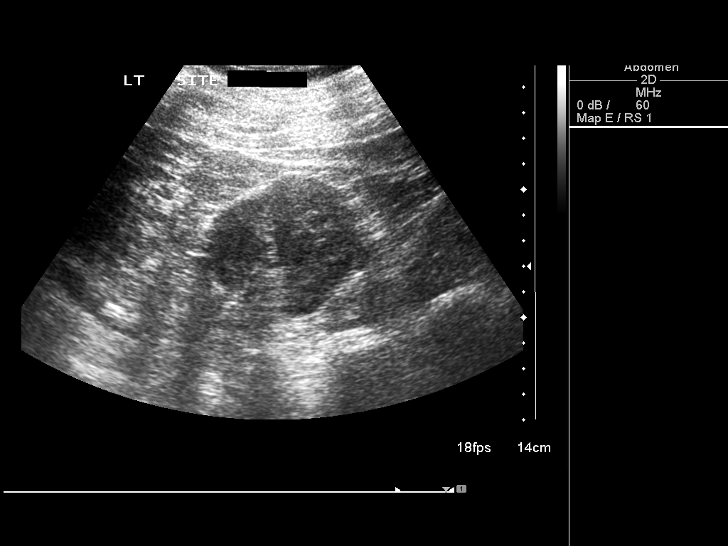

[14 of 25 positions shown; findings below may reference images not displayed]

FINDINGS: Gallbladder: No gallstones or wall thickening visualized. No
sonographic Murphy sign noted by sonographer.

Common bile duct: Diameter: 4 mm.

Liver: No focal lesion identified. Within normal limits in
parenchymal echogenicity.

IVC: No abnormality visualized.

Pancreas: Visualized portion unremarkable.

Spleen: Size and appearance within normal limits.

Right Kidney: Length: 11.5 cm. Echogenicity within normal limits. 9
mm cyst over the upper pole. No hydronephrosis visualized.

Left Kidney: Length: 11.1 cm. Echogenicity within normal limits. No
mass or hydronephrosis visualized.

Abdominal aorta: No aneurysm visualized.

Other findings: None.
IMPRESSION: No acute findings.

9 mm right renal cyst.

## 2022-06-04 ENCOUNTER — Other Ambulatory Visit: Payer: Self-pay | Admitting: Podiatry

## 2022-06-04 NOTE — Patient Instructions (Signed)
Your procedure is scheduled on: 06/10/2022  Report to [facility] Main Entrance at    6:00 AM.  Call this number if you have problems the morning of surgery: [phone]   Remember:   Do not Eat or Drink after midnight         No Smoking the morning of surgery  :  Take these medicines the morning of surgery with A SIP OF WATER: Nexium   Do not wear jewelry, make-up or nail polish.  Do not wear lotions, powders, or perfumes. You may wear deodorant.  Do not shave 48 hours prior to surgery. Men may shave face and neck.  Do not bring valuables to the hospital.  Contacts, dentures or bridgework may not be worn into surgery.  Leave suitcase in the car. After surgery it may be brought to your room.  For patients admitted to the hospital, checkout time is 11:00 AM the day of discharge.   Patients discharged the day of surgery will not be allowed to drive home.    Special Instructions: Shower using CHG night before surgery and shower the day of surgery use CHG.  Use special wash - you have one bottle of CHG for all showers.  You should use approximately 1/2 of the bottle for each shower. How to Use Chlorhexidine Before Surgery Chlorhexidine gluconate (CHG) is a germ-killing (antiseptic) solution that is used to clean the skin. It can get rid of the bacteria that normally live on the skin and can keep them away for about 24 hours. To clean your skin with CHG, you may be given: A CHG solution to use in the shower or as part of a sponge bath. A prepackaged cloth that contains CHG. Cleaning your skin with CHG may help lower the risk for infection: While you are staying in the intensive care unit of the hospital. If you have a vascular access, such as a central line, to provide short-term or long-term access to your veins. If you have a catheter to drain urine from your bladder. If you are on a ventilator. A ventilator is a machine that helps you breathe by moving air in and out of your  lungs. After surgery. What are the risks? Risks of using CHG include: A skin reaction. Hearing loss, if CHG gets in your ears and you have a perforated eardrum. Eye injury, if CHG gets in your eyes and is not rinsed out. The CHG product catching fire. Make sure that you avoid smoking and flames after applying CHG to your skin. Do not use CHG: If you have a chlorhexidine allergy or have previously reacted to chlorhexidine. On babies younger than 46 months of age. How to use CHG solution Use CHG only as told by your health care provider, and follow the instructions on the label. Use the full amount of CHG as directed. Usually, this is one bottle. During a shower Follow these steps when using CHG solution during a shower (unless your health care provider gives you different instructions): Start the shower. Use your normal soap and shampoo to wash your face and hair. Turn off the shower or move out of the shower stream. Pour the CHG onto a clean washcloth. Do not use any type of brush or rough-edged sponge. Starting at your neck, lather your body down to your toes. Make sure you follow these instructions: If you will be having surgery, pay special attention to the part of your body where you will be having surgery. Scrub this  area for at least 1 minute. Do not use CHG on your head or face. If the solution gets into your ears or eyes, rinse them well with water. Avoid your genital area. Avoid any areas of skin that have broken skin, cuts, or scrapes. Scrub your back and under your arms. Make sure to wash skin folds. Let the lather sit on your skin for 1-2 minutes or as long as told by your health care provider. Thoroughly rinse your entire body in the shower. Make sure that all body creases and crevices are rinsed well. Dry off with a clean towel. Do not put any substances on your body afterward--such as powder, lotion, or perfume--unless you are told to do so by your health care provider.  Only use lotions that are recommended by the manufacturer. Put on clean clothes or pajamas. If it is the night before your surgery, sleep in clean sheets.  During a sponge bath Follow these steps when using CHG solution during a sponge bath (unless your health care provider gives you different instructions): Use your normal soap and shampoo to wash your face and hair. Pour the CHG onto a clean washcloth. Starting at your neck, lather your body down to your toes. Make sure you follow these instructions: If you will be having surgery, pay special attention to the part of your body where you will be having surgery. Scrub this area for at least 1 minute. Do not use CHG on your head or face. If the solution gets into your ears or eyes, rinse them well with water. Avoid your genital area. Avoid any areas of skin that have broken skin, cuts, or scrapes. Scrub your back and under your arms. Make sure to wash skin folds. Let the lather sit on your skin for 1-2 minutes or as long as told by your health care provider. Using a different clean, wet washcloth, thoroughly rinse your entire body. Make sure that all body creases and crevices are rinsed well. Dry off with a clean towel. Do not put any substances on your body afterward--such as powder, lotion, or perfume--unless you are told to do so by your health care provider. Only use lotions that are recommended by the manufacturer. Put on clean clothes or pajamas. If it is the night before your surgery, sleep in clean sheets. How to use CHG prepackaged cloths Only use CHG cloths as told by your health care provider, and follow the instructions on the label. Use the CHG cloth on clean, dry skin. Do not use the CHG cloth on your head or face unless your health care provider tells you to. When washing with the CHG cloth: Avoid your genital area. Avoid any areas of skin that have broken skin, cuts, or scrapes. Before surgery Follow these steps when using a  CHG cloth to clean before surgery (unless your health care provider gives you different instructions): Using the CHG cloth, vigorously scrub the part of your body where you will be having surgery. Scrub using a back-and-forth motion for 3 minutes. The area on your body should be completely wet with CHG when you are done scrubbing. Do not rinse. Discard the cloth and let the area air-dry. Do not put any substances on the area afterward, such as powder, lotion, or perfume. Put on clean clothes or pajamas. If it is the night before your surgery, sleep in clean sheets.  For general bathing Follow these steps when using CHG cloths for general bathing (unless your health care provider gives  you different instructions). Use a separate CHG cloth for each area of your body. Make sure you wash between any folds of skin and between your fingers and toes. Wash your body in the following order, switching to a new cloth after each step: The front of your neck, shoulders, and chest. Both of your arms, under your arms, and your hands. Your stomach and groin area, avoiding the genitals. Your right leg and foot. Your left leg and foot. The back of your neck, your back, and your buttocks. Do not rinse. Discard the cloth and let the area air-dry. Do not put any substances on your body afterward--such as powder, lotion, or perfume--unless you are told to do so by your health care provider. Only use lotions that are recommended by the manufacturer. Put on clean clothes or pajamas. Contact a health care provider if: Your skin gets irritated after scrubbing. You have questions about using your solution or cloth. You swallow any chlorhexidine. Call your local poison control center [phone] in the [place] Get help right away if: Your eyes itch badly, or they become very red or swollen. Your skin itches badly and is red or swollen. Your hearing changes. You have trouble seeing. You have swelling or tingling in  your mouth or throat. You have trouble breathing. These symptoms may represent a serious problem that is an emergency. Do not wait to see if the symptoms will go away. Get medical help right away. Call your local emergency services (911 in the [place] Do not drive yourself to the hospital. Summary Chlorhexidine gluconate (CHG) is a germ-killing (antiseptic) solution that is used to clean the skin. Cleaning your skin with CHG may help to lower your risk for infection. You may be given CHG to use for bathing. It may be in a bottle or in a prepackaged cloth to use on your skin. Carefully follow your health care provider's instructions and the instructions on the product label. Do not use CHG if you have a chlorhexidine allergy. Contact your health care provider if your skin gets irritated after scrubbing. This information is not intended to replace advice given to you by your health care provider. Make sure you discuss any questions you have with your health care provider. Document Revised: 04/28/2021 Document Reviewed: 03/11/2020 Elsevier Patient Education  2023 [employer] Toe Deformity Repair, Care After This sheet gives you information about how to care for yourself after your procedure. Your health care provider may also give you more specific instructions. If you have problems or questions, contact your health care provider. What can I expect after the procedure? After the procedure, it is common to have: Pain in the affected area. Discomfort with walking. Follow these instructions at home: If you have a postoperative shoe:  Wear the shoe as told by your health care provider. Remove it only as told by your health care provider. Loosen the shoe if your toes tingle, become numb, or turn cold and blue. Keep the shoe clean and dry. Bathing Do not take baths, swim, or use a hot tub until your health care provider approves. Ask your health care provider if you can take showers. You may only be  allowed to take sponge baths. If your postoperative shoe is not waterproof, cover it with a watertight covering when you take a bath or a shower. Keep the bandage (dressing) dry until your health care provider says it can be removed. Incision care  Follow instructions from your health care provider about how to  take care of your incision. Make sure you: Wash your hands with soap and water for at least 20 seconds before and after you change your dressing. If soap and water are not available, use hand sanitizer. Change your dressing as told by your health care provider. Leave stitches (sutures), skin glue, or adhesive strips in place. These skin closures may need to stay in place for 2 weeks or longer. If adhesive strip edges start to loosen and curl up, you may trim the loose edges. Do not remove adhesive strips completely unless your health care provider tells you to do that. Check your incision area every day for signs of infection. Check for: More redness, swelling, or pain. Fluid or blood. Warmth. Pus or a bad smell. Managing pain, stiffness, and swelling  If directed, put ice on the affected area. To do this: Put ice in a plastic bag. Place a towel between your skin and the bag. Leave the ice on for 20 minutes, 2-3 times a day. Move your toes often to avoid stiffness and to lessen swelling. Raise (elevate) the affected foot above the level of your heart while you are sitting or lying down. Driving Ask your health care provider if the medicine prescribed to you requires you to avoid driving or using machinery. Ask your health care provider when it is safe to drive if you have a postoperative shoe on your foot. Activity Walk and return to your normal activities as told by your health care provider. Ask your health care provider what activities are safe for you. Do not use your affected foot to support your body weight until your health care provider says that you can. Use crutches as  directed by your health care provider. Do exercises as told by your health care provider or physical therapist. General instructions Take over-the-counter and prescription medicines only as told by your health care provider. Ask your health care provider if the medicine prescribed to you can cause constipation. You may need to take these actions to prevent or treat constipation: Drink enough fluid to keep your urine pale yellow. Take over-the-counter or prescription medicines. Eat foods that are high in fiber, such as beans, whole grains, and fresh fruits and vegetables. Limit foods that are high in fat and processed sugars, such as fried or sweet foods. Do not use any products that contain nicotine or tobacco, such as cigarettes, e-cigarettes, and chewing tobacco. These can delay bone healing. If you need help quitting, ask your health care provider. Keep all follow-up visits. This is important. Contact a health care provider if: You have more redness, swelling, or pain at your incision site. You notice redness extending from the surgical site upward. You have fluid or blood coming from your incision. Your incision feels warm to the touch. You have pus or a bad smell coming from the incision area or the dressing. Your leg swells. You have a fever. Get help right away if: You develop a rash. You have chest pain or difficulty breathing. These symptoms may represent a serious problem that is an emergency. Do not wait to see if the symptoms will go away. Get medical help right away. Call your local emergency services (911 in the [place] Do not drive yourself to the hospital. Summary After the procedure, it is common to have pain in the affected area and discomfort with walking. Follow instructions from your health care provider about how to take care of your incision. Do not use your affected foot to support  your body weight until your health care provider says that you can. Use crutches as  directed by your health care provider. Walk and return to your normal activities as told by your health care provider. Ask your health care provider what activities are safe for you. Keep all follow-up visits as told by your health care provider. This information is not intended to replace advice given to you by your health care provider. Make sure you discuss any questions you have with your health care provider. Document Revised: 04/06/2019 Document Reviewed: 04/06/2019 Elsevier Patient Education  2024 Else[employer] Inc. Monitored Anesthesia Care, Care After The following information offers guidance on how to care for yourself after your procedure. Your health care provider may also give you more specific instructions. If you have problems or questions, contact your health care provider. What can I expect after the procedure? After the procedure, it is common to have: Tiredness. Little or no memory about what happened during or after the procedure. Impaired judgment when it comes to making decisions. Nausea or vomiting. Some trouble with balance. Follow these instructions at home: For the time period you were told by your health care provider:  Rest. Do not participate in activities where you could fall or become injured. Do not drive or use machinery. Do not drink alcohol. Do not take sleeping pills or medicines that cause drowsiness. Do not make important decisions or sign legal documents. Do not take care of children on your own. Medicines Take over-the-counter and prescription medicines only as told by your health care provider. If you were prescribed antibiotics, take them as told by your health care provider. Do not stop using the antibiotic even if you start to feel better. Eating and drinking Follow instructions from your health care provider about what you may eat and drink. Drink enough fluid to keep your urine pale yellow. If you vomit: Drink clear fluids slowly and in small  amounts as you are able. Clear fluids include water, ice chips, low-calorie sports drinks, and fruit juice that has water added to it (diluted fruit juice). Eat light and bland foods in small amounts as you are able. These foods include bananas, applesauce, rice, lean meats, toast, and crackers. General instructions  Have a responsible adult stay with you for the time you are told. It is important to have someone help care for you until you are awake and alert. If you have sleep apnea, surgery and some medicines can increase your risk for breathing problems. Follow instructions from your health care provider about wearing your sleep device: When you are sleeping. This includes during daytime naps. While taking prescription pain medicines, sleeping medicines, or medicines that make you drowsy. Do not use any products that contain nicotine or tobacco. These products include cigarettes, chewing tobacco, and vaping devices, such as e-cigarettes. If you need help quitting, ask your health care provider. Contact a health care provider if: You feel nauseous or vomit every time you eat or drink. You feel light-headed. You are still sleepy or having trouble with balance after 24 hours. You get a rash. You have a fever. You have redness or swelling around the IV site. Get help right away if: You have trouble breathing. You have new confusion after you get home. These symptoms may be an emergency. Get help right away. Call 911. Do not wait to see if the symptoms will go away. Do not drive yourself to the hospital. This information is not intended to replace advice given to  you by your health care provider. Make sure you discuss any questions you have with your health care provider. Document Revised: 05/26/2021 Document Reviewed: 05/26/2021 Elsevier Patient Education  2024 [employer].

## 2022-06-05 ENCOUNTER — Other Ambulatory Visit (HOSPITAL_COMMUNITY): Payer: BLUE CROSS/BLUE SHIELD

## 2022-06-09 ENCOUNTER — Other Ambulatory Visit (HOSPITAL_COMMUNITY): Payer: Self-pay | Admitting: Podiatry

## 2022-06-09 ENCOUNTER — Encounter (HOSPITAL_COMMUNITY)
Admission: RE | Admit: 2022-06-09 | Discharge: 2022-06-09 | Disposition: A | Discharge status: Home / Self Care | Payer: 59 | Source: Ambulatory Visit | Attending: Podiatry | Admitting: Podiatry

## 2022-06-09 ENCOUNTER — Encounter (HOSPITAL_COMMUNITY): Payer: Self-pay

## 2022-06-09 ENCOUNTER — Ambulatory Visit (HOSPITAL_COMMUNITY)
Admission: RE | Admit: 2022-06-09 | Discharge: 2022-06-09 | Disposition: A | Discharge status: Home / Self Care | Payer: 59 | Source: Ambulatory Visit | Attending: Podiatry | Admitting: Podiatry

## 2022-06-09 DIAGNOSIS — M205X1 Other deformities of toe(s) (acquired), right foot: Secondary | ICD-10-CM

## 2022-06-09 HISTORY — DX: Unspecified osteoarthritis, unspecified site: M19.90

## 2022-06-10 ENCOUNTER — Ambulatory Visit (HOSPITAL_COMMUNITY): Payer: 59

## 2022-06-10 ENCOUNTER — Ambulatory Visit (HOSPITAL_COMMUNITY): Payer: 59 | Admitting: Certified Registered"

## 2022-06-10 ENCOUNTER — Encounter (HOSPITAL_COMMUNITY)
Admission: RE | Disposition: A | Discharge status: Home / Self Care | Payer: Self-pay | Source: Home / Self Care | Attending: Podiatry

## 2022-06-10 ENCOUNTER — Ambulatory Visit: Admit: 2022-06-10 | Payer: BLUE CROSS/BLUE SHIELD

## 2022-06-10 ENCOUNTER — Encounter (HOSPITAL_COMMUNITY): Payer: Self-pay

## 2022-06-10 ENCOUNTER — Ambulatory Visit (HOSPITAL_COMMUNITY)
Admission: RE | Admit: 2022-06-10 | Discharge: 2022-06-10 | Disposition: A | Discharge status: Home / Self Care | Payer: 59 | Attending: Podiatry | Admitting: Podiatry

## 2022-06-10 ENCOUNTER — Ambulatory Visit (HOSPITAL_BASED_OUTPATIENT_CLINIC_OR_DEPARTMENT_OTHER): Payer: 59 | Admitting: Certified Registered"

## 2022-06-10 DIAGNOSIS — M2011 Hallux valgus (acquired), right foot: Secondary | ICD-10-CM

## 2022-06-10 DIAGNOSIS — K219 Gastro-esophageal reflux disease without esophagitis: Secondary | ICD-10-CM | POA: Insufficient documentation

## 2022-06-10 DIAGNOSIS — M205X1 Other deformities of toe(s) (acquired), right foot: Secondary | ICD-10-CM | POA: Diagnosis not present

## 2022-06-10 DIAGNOSIS — Z87891 Personal history of nicotine dependence: Secondary | ICD-10-CM | POA: Insufficient documentation

## 2022-06-10 HISTORY — PX: HALLUX VALGUS CHEILECTOMY: SHX6625

## 2022-06-10 SURGERY — CHEILECTOMY, GREAT TOE, WITH IMPLANT INSERTION
Anesthesia: General | Site: Foot | Laterality: Right

## 2022-06-10 SURGERY — CHEILECTOMY, GREAT TOE, WITH IMPLANT INSERTION
Anesthesia: Monitor Anesthesia Care | Laterality: Right

## 2022-06-10 MED ORDER — LIDOCAINE HCL (PF) 1 % IJ SOLN
INTRAMUSCULAR | Status: AC
  Filled 2022-06-10: qty 30

## 2022-06-10 MED ORDER — OXYCODONE HCL 5 MG PO TABS: 5.0000 mg | ORAL_TABLET | Freq: Once | ORAL | Status: DC | PRN

## 2022-06-10 MED ORDER — FENTANYL CITRATE (PF) 100 MCG/2ML IJ SOLN
INTRAMUSCULAR | Status: DC | PRN
  Administered 2022-06-10: 50 ug via INTRAVENOUS
  Administered 2022-06-10 (×2): 25 ug via INTRAVENOUS

## 2022-06-10 MED ORDER — DEXMEDETOMIDINE HCL IN NACL 80 MCG/20ML IV SOLN
INTRAVENOUS | Status: AC
  Filled 2022-06-10: qty 20

## 2022-06-10 MED ORDER — PHENYLEPHRINE 80 MCG/ML (10ML) SYRINGE FOR IV PUSH (FOR BLOOD PRESSURE SUPPORT)
PREFILLED_SYRINGE | INTRAVENOUS | Status: AC
  Filled 2022-06-10: qty 10

## 2022-06-10 MED ORDER — PROPOFOL 500 MG/50ML IV EMUL
INTRAVENOUS | Status: DC | PRN
  Administered 2022-06-10: 150 ug/kg/min via INTRAVENOUS

## 2022-06-10 MED ORDER — DEXMEDETOMIDINE HCL IN NACL 80 MCG/20ML IV SOLN
INTRAVENOUS | Status: DC | PRN
  Administered 2022-06-10: 8 ug via INTRAVENOUS

## 2022-06-10 MED ORDER — LIDOCAINE HCL (PF) 2 % IJ SOLN
INTRAMUSCULAR | Status: AC
  Filled 2022-06-10: qty 5

## 2022-06-10 MED ORDER — ONDANSETRON HCL 4 MG/2ML IJ SOLN
INTRAMUSCULAR | Status: AC
  Filled 2022-06-10: qty 2

## 2022-06-10 MED ORDER — EPHEDRINE 5 MG/ML INJ
INTRAVENOUS | Status: AC
  Filled 2022-06-10: qty 5

## 2022-06-10 MED ORDER — FENTANYL CITRATE PF 50 MCG/ML IJ SOSY: 25.0000 ug | PREFILLED_SYRINGE | INTRAMUSCULAR | Status: DC | PRN

## 2022-06-10 MED ORDER — LACTATED RINGERS IV SOLN: INTRAVENOUS | Status: DC | PRN

## 2022-06-10 MED ORDER — CEFAZOLIN SODIUM-DEXTROSE 2-4 GM/100ML-% IV SOLN
2.0000 g | INTRAVENOUS | Status: AC
  Administered 2022-06-10: 2 g via INTRAVENOUS

## 2022-06-10 MED ORDER — PROPOFOL 10 MG/ML IV BOLUS
INTRAVENOUS | Status: DC | PRN
  Administered 2022-06-10: 40 mg via INTRAVENOUS

## 2022-06-10 MED ORDER — DEXAMETHASONE SODIUM PHOSPHATE 10 MG/ML IJ SOLN
INTRAMUSCULAR | Status: DC | PRN
  Administered 2022-06-10: 4 mg via INTRAVENOUS

## 2022-06-10 MED ORDER — CEFAZOLIN SODIUM-DEXTROSE 2-4 GM/100ML-% IV SOLN
INTRAVENOUS | Status: AC
  Filled 2022-06-10: qty 100

## 2022-06-10 MED ORDER — LIDOCAINE HCL 1 % IJ SOLN
INTRAMUSCULAR | Status: DC | PRN
  Administered 2022-06-10 (×2): 10 mL via INTRAMUSCULAR

## 2022-06-10 MED ORDER — OXYCODONE HCL 5 MG/5ML PO SOLN: 5.0000 mg | Freq: Once | ORAL | Status: DC | PRN

## 2022-06-10 MED ORDER — PHENYLEPHRINE 80 MCG/ML (10ML) SYRINGE FOR IV PUSH (FOR BLOOD PRESSURE SUPPORT)
PREFILLED_SYRINGE | INTRAVENOUS | Status: DC | PRN
  Administered 2022-06-10: 80 ug via INTRAVENOUS
  Administered 2022-06-10 (×8): 100 ug via INTRAVENOUS
  Administered 2022-06-10: 480 ug via INTRAVENOUS

## 2022-06-10 MED ORDER — DEXAMETHASONE SODIUM PHOSPHATE 10 MG/ML IJ SOLN
INTRAMUSCULAR | Status: AC
  Filled 2022-06-10: qty 1

## 2022-06-10 MED ORDER — FENTANYL CITRATE (PF) 100 MCG/2ML IJ SOLN
INTRAMUSCULAR | Status: AC
  Filled 2022-06-10: qty 2

## 2022-06-10 MED ORDER — PROPOFOL 10 MG/ML IV BOLUS
INTRAVENOUS | Status: AC
  Filled 2022-06-10: qty 20

## 2022-06-10 MED ORDER — MIDAZOLAM HCL 2 MG/2ML IJ SOLN
INTRAMUSCULAR | Status: AC
  Filled 2022-06-10: qty 2

## 2022-06-10 MED ORDER — BUPIVACAINE HCL (PF) 0.5 % IJ SOLN
INTRAMUSCULAR | Status: AC
  Filled 2022-06-10: qty 30

## 2022-06-10 MED ORDER — ONDANSETRON HCL 4 MG/2ML IJ SOLN: 4.0000 mg | Freq: Once | INTRAMUSCULAR | Status: DC | PRN

## 2022-06-10 MED ORDER — 0.9 % SODIUM CHLORIDE (POUR BTL) OPTIME
TOPICAL | Status: DC | PRN
  Administered 2022-06-10: 1000 mL

## 2022-06-10 MED ORDER — ONDANSETRON HCL 4 MG/2ML IJ SOLN
INTRAMUSCULAR | Status: DC | PRN
  Administered 2022-06-10: 4 mg via INTRAVENOUS

## 2022-06-10 MED ORDER — EPHEDRINE SULFATE-NACL 50-0.9 MG/10ML-% IV SOSY
PREFILLED_SYRINGE | INTRAVENOUS | Status: DC | PRN
  Administered 2022-06-10 (×3): 5 mg via INTRAVENOUS
  Administered 2022-06-10: 10 mg via INTRAVENOUS

## 2022-06-10 MED ORDER — MIDAZOLAM HCL 2 MG/2ML IJ SOLN
INTRAMUSCULAR | Status: DC | PRN
  Administered 2022-06-10: 2 mg via INTRAVENOUS

## 2022-06-10 SURGICAL SUPPLY — 46 items
APL PRP STRL LF DISP 70% ISPRP (MISCELLANEOUS) ×1
APL SKNCLS STERI-STRIP NONHPOA (GAUZE/BANDAGES/DRESSINGS) ×1
BANDAGE ESMARK 4X12 BL STRL LF (DISPOSABLE) ×1 IMPLANT
BENZOIN TINCTURE PRP APPL 2/3 (GAUZE/BANDAGES/DRESSINGS) ×1 IMPLANT
BLADE AVERAGE 25X9 (BLADE) ×1 IMPLANT
BLADE SURG 15 STRL LF DISP TIS (BLADE) ×2 IMPLANT
BLADE SURG 15 STRL SS (BLADE) ×1
BNDG CMPR 12X4 ELC STRL LF (DISPOSABLE) ×1
BNDG CMPR STD VLCR NS LF 5.8X4 (GAUZE/BANDAGES/DRESSINGS) ×1
BNDG CONFORM 2 STRL LF (GAUZE/BANDAGES/DRESSINGS) ×1 IMPLANT
BNDG ELASTIC 4X5.8 VLCR NS LF (GAUZE/BANDAGES/DRESSINGS) ×1 IMPLANT
BNDG ESMARK 4X12 BLUE STRL LF (DISPOSABLE) ×1
BNDG GAUZE DERMACEA FLUFF 4 (GAUZE/BANDAGES/DRESSINGS) IMPLANT
BNDG GAUZE ELAST 4 BULKY (GAUZE/BANDAGES/DRESSINGS) ×1 IMPLANT
BNDG GZE DERMACEA 4 6PLY (GAUZE/BANDAGES/DRESSINGS) ×1
BOOT STEPPER DURA LG (SOFTGOODS) IMPLANT
BOOT STEPPER DURA XLG (SOFTGOODS) IMPLANT
CHLORAPREP W/TINT 26 (MISCELLANEOUS) ×1 IMPLANT
CLOTH BEACON ORANGE TIMEOUT ST (SAFETY) ×1 IMPLANT
COVER LIGHT HANDLE STERIS (MISCELLANEOUS) ×2 IMPLANT
CUFF TOURN SGL QUICK 18X4 (TOURNIQUET CUFF) IMPLANT
DECANTER SPIKE VIAL GLASS SM (MISCELLANEOUS) ×1 IMPLANT
DRSG ADAPTIC 3X8 NADH LF (GAUZE/BANDAGES/DRESSINGS) ×1 IMPLANT
ELECT REM PT RETURN 9FT ADLT (ELECTROSURGICAL) ×1
ELECTRODE REM PT RTRN 9FT ADLT (ELECTROSURGICAL) ×1 IMPLANT
GAUZE SPONGE 4X4 12PLY STRL (GAUZE/BANDAGES/DRESSINGS) ×1 IMPLANT
GLOVE BIO SURGEON STRL SZ7 (GLOVE) IMPLANT
GLOVE BIO SURGEON STRL SZ7.5 (GLOVE) ×1 IMPLANT
GLOVE BIOGEL PI IND STRL 7.0 (GLOVE) ×2 IMPLANT
GLOVE ECLIPSE 6.5 STRL STRAW (GLOVE) IMPLANT
GLOVE ECLIPSE 7.0 STRL STRAW (GLOVE) ×1 IMPLANT
GOWN STRL REUS W/ TWL LRG LVL3 (GOWN DISPOSABLE) ×1 IMPLANT
GOWN STRL REUS W/TWL LRG LVL3 (GOWN DISPOSABLE) ×3 IMPLANT
MANIFOLD NEPTUNE II (INSTRUMENTS) ×1 IMPLANT
NDL HYPO 25X1 1.5 SAFETY (NEEDLE) IMPLANT
NEEDLE HYPO 25X1 1.5 SAFETY (NEEDLE) ×2 IMPLANT
NS IRRIG 1000ML POUR BTL (IV SOLUTION) ×1 IMPLANT
PACK BASIC LIMB (CUSTOM PROCEDURE TRAY) ×1 IMPLANT
PAD ARMBOARD 7.5X6 YLW CONV (MISCELLANEOUS) ×1 IMPLANT
RASP SM TEAR CROSS CUT (RASP) IMPLANT
SET BASIN LINEN APH (SET/KITS/TRAYS/PACK) ×1 IMPLANT
STRIP CLOSURE SKIN 1/2X4 (GAUZE/BANDAGES/DRESSINGS) IMPLANT
SUT PROLENE 4 0 PS 2 18 (SUTURE) IMPLANT
SUT VIC AB 2-0 CT2 27 (SUTURE) IMPLANT
SUT VICRYL AB 3-0 FS1 BRD 27IN (SUTURE) ×1 IMPLANT
SYR CONTROL 10ML LL (SYRINGE) ×2 IMPLANT

## 2022-06-10 NOTE — Op Note (Signed)
06/10/2022  9:10 AM  PATIENT:  Derrick Martinez  62 y.o. male  PRE-OPERATIVE DIAGNOSIS:  M20.5X1- RIGHT HALLUX LIMITUS  POST-OPERATIVE DIAGNOSIS:  M20.5X1- RIGHT HALLUX LIMITUS  PROCEDURE:  Procedure(s): HALLUX VALGUS CHEILECTOMY (Right)  SURGEON:  Surgeon(s) and Role:    * Erskine Emery, DPM - Primary  PHYSICIAN ASSISTANT:   ASSISTANTS: none   ANESTHESIA:   local and MAC  EBL:  None  BLOOD ADMINISTERED:none   LOCAL MEDICATIONS USED:  MARCAINE   , LIDOCAINE , and Amount: 10 ml pre and 10ml post op  SPECIMEN:  Source of Specimen:  Right great toe.   DISPOSITION OF SPECIMEN:  PATHOLOGY  COUNTS:  YES  TOURNIQUET:   Total Tourniquet Time Documented: Ankle (Right) - 72 minutes Total: Ankle (Right) - 72 minutes   PLAN OF CARE: Discharge to home after PACU  PATIENT DISPOSITION:  PACU - hemodynamically stable.   Patient was brought into the operating room laid supine on the operating table. Ankle tourniquet was applied to the surgical extremity. Following IV sedation, a local block was achieved using 10 cc of mixture of 1% plain lidocaine with 0.5% marcaine. The foot was the prepped, scrubbed and draped in aseptic manner. Using an esmarch band the tourniquet on the surgical site was inflatted at .    Attention was then directed to the dorsomedial aspect of the right first metatarsophalangeal joint where a 6-cm linear incision was made medial and parallel to the course of the extensor hallucis longus tendon.  The incision was deepened through subcutaneous tissues.  Vital neurovascular structures were identified and retracted.  All bleeders were identified and cauterized.  The incision was deepened to the level of the first metatarsophalangeal joint capsule.  Boney exostosis noted over the dorsal aspect of the first metatarsophalangeal joint with inflammatory changes to the capsule.  The capsular and periosteal structures were dissected free of their osseous  attachments and reflected medially and laterally thus exposing the head of the first metatarsal at the operative site.  Utilizing a sagittal saw, the dorsal prominence was resected and passed from the operative field.  All rough edges were smoothed.    Attention was directed to the first interspace via the original skin incision.  There was prominent bone exostosis noted on the lateral aspect of the first metatarsal head. Using rongeur all the of the exostosis was removed. The area was smoothed using power rasp. No boney exostosis were felt or seen. The surgical site was irrigated with normal saline. The capsule was closed using 2-0 Vicryl. The subcutenous tissue was closed using 3-0 Vicryl. Skin was close using 4-0 nylon. Dry sterile dressing applied. The tourniquet was deflated. Capillary refill time was brisk to lesser toes.   Patient was transferred to PACU and will be placed in surgical shoe with limited weight bearing to heel.

## 2022-06-10 NOTE — Brief Op Note (Signed)
06/10/2022  9:10 AM  PATIENT:  Derrick Martinez  62 y.o. male  PRE-OPERATIVE DIAGNOSIS:  M20.5X1- RIGHT HALLUX LIMITUS  POST-OPERATIVE DIAGNOSIS:  M20.5X1- RIGHT HALLUX LIMITUS  PROCEDURE:  Procedure(s): HALLUX VALGUS CHEILECTOMY (Right)  SURGEON:  Surgeon(s) and Role:    * Erskine Emery, DPM - Primary  PHYSICIAN ASSISTANT:   ASSISTANTS: none   ANESTHESIA:   local and MAC  EBL:  None  BLOOD ADMINISTERED:none  DRAINS: none   LOCAL MEDICATIONS USED:  MARCAINE   , LIDOCAINE , and Amount: 10 ml pre and 10ml post op  SPECIMEN:  Source of Specimen:  Right great toe.   DISPOSITION OF SPECIMEN:  PATHOLOGY  COUNTS:  YES  TOURNIQUET:   Total Tourniquet Time Documented: Ankle (Right) - 72 minutes Total: Ankle (Right) - 72 minutes   DICTATION: .Reubin Milan Dictation  PLAN OF CARE: Discharge to home after PACU  PATIENT DISPOSITION:  PACU - hemodynamically stable.   Delay start of Pharmacological VTE agent (>24hrs) due to surgical blood loss or risk of bleeding: not applicable

## 2022-06-10 NOTE — Discharge Instructions (Signed)

## 2022-06-10 NOTE — Anesthesia Preprocedure Evaluation (Signed)
Anesthesia Evaluation  Patient identified by MRN, date of birth, ID band Patient awake    Reviewed: Allergy & Precautions, H&P , NPO status , Patient's Chart, lab work & pertinent test results, reviewed documented beta blocker date and time   History of Anesthesia Complications (+) PONV and history of anesthetic complications  Airway Mallampati: II  TM Distance: >3 FB Neck ROM: full    Dental no notable dental hx.    Pulmonary neg pulmonary ROS, former smoker   Pulmonary exam normal breath sounds clear to auscultation       Cardiovascular Exercise Tolerance: Good negative cardio ROS  Rhythm:regular Rate:Normal     Neuro/Psych negative neurological ROS  negative psych ROS   GI/Hepatic negative GI ROS, Neg liver ROS,GERD  ,,  Endo/Other  negative endocrine ROS    Renal/GU negative Renal ROS  negative genitourinary   Musculoskeletal   Abdominal   Peds  Hematology negative hematology ROS (+)   Anesthesia Other Findings   Reproductive/Obstetrics negative OB ROS                             Anesthesia Physical Anesthesia Plan  ASA: 2  Anesthesia Plan: General   Post-op Pain Management:    Induction:   PONV Risk Score and Plan: Propofol infusion  Airway Management Planned:   Additional Equipment:   Intra-op Plan:   Post-operative Plan:   Informed Consent: I have reviewed the patients History and Physical, chart, labs and discussed the procedure including the risks, benefits and alternatives for the proposed anesthesia with the patient or authorized representative who has indicated his/her understanding and acceptance.     Dental Advisory Given  Plan Discussed with: CRNA  Anesthesia Plan Comments:        Anesthesia Quick Evaluation  

## 2022-06-10 NOTE — H&P (Signed)
.  HISTORY AND PHYSICAL INTERVAL NOTE:  06/10/2022  7:10 AM  Derrick Martinez  has presented today for surgery, with the diagnosis of M20.5X1- RIGHT HALLUX LIMITUS.  The various methods of treatment have been discussed with the patient.  No guarantees were given.  After consideration of risks, benefits and other options for treatment, the patient has consented to surgery.  I have reviewed the patients' chart and labs.    Patient Vitals for the past 24 hrs:  BP Temp Pulse Resp SpO2 Height Weight  06/10/22 0700 (!) 122/91 -- 68 14 100 % -- --  06/10/22 0654 120/81 97.7 F (36.5 C) 66 16 100 % -- --  06/10/22 0615 -- -- -- -- -- 6' (1.829 m) 93 kg    A history and physical examination was performed in my office.  The patient was reexamined.  There have been no changes to this history and physical examination.  Derrick Martinez, DPM

## 2022-06-10 NOTE — Transfer of Care (Signed)
Immediate Anesthesia Transfer of Care Note  Patient: Derrick Martinez  Procedure(s) Performed: HALLUX VALGUS CHEILECTOMY (Right: Foot)  Patient Location: PACU  Anesthesia Type:General  Level of Consciousness: awake  Airway & Oxygen Therapy: Patient Spontanous Breathing and Patient connected to face mask oxygen  Post-op Assessment: Report given to RN and Post -op Vital signs reviewed and stable  Post vital signs: Reviewed and stable  Last Vitals:  Vitals Value Taken Time  BP 110/75 06/10/22 0913  Temp 36.4 C 06/10/22 0912  Pulse 75 06/10/22 0915  Resp 16 06/10/22 0915  SpO2 100 % 06/10/22 0915  Vitals shown include unvalidated device data.  Last Pain:  Vitals:   06/10/22 0653  PainSc: 0-No pain         Complications: No notable events documented.

## 2022-06-11 ENCOUNTER — Other Ambulatory Visit: Payer: Self-pay

## 2022-06-11 LAB — SURGICAL PATHOLOGY

## 2022-06-14 NOTE — Anesthesia Postprocedure Evaluation (Signed)
Anesthesia Post Note  Patient: Derrick Martinez  Procedure(s) Performed: HALLUX VALGUS CHEILECTOMY (Right: Foot)  Patient location during evaluation: Phase II Anesthesia Type: General Level of consciousness: awake Pain management: pain level controlled Vital Signs Assessment: post-procedure vital signs reviewed and stable Respiratory status: spontaneous breathing and respiratory function stable Cardiovascular status: blood pressure returned to baseline and stable Postop Assessment: no headache and no apparent nausea or vomiting Anesthetic complications: no Comments: Late entry   No notable events documented.   Last Vitals:  Vitals:   06/10/22 0930 06/10/22 0948  BP: 123/81 (!) 130/90  Pulse: 82 80  Resp: 12   Temp:  36.5 C  SpO2: 97% 100%    Last Pain:  Vitals:   06/11/22 1317  TempSrc:   PainSc: 6                  Windell Norfolk

## 2022-06-15 ENCOUNTER — Encounter (HOSPITAL_COMMUNITY): Payer: Self-pay | Admitting: Podiatry

## 2022-10-12 ENCOUNTER — Ambulatory Visit
Admission: EM | Admit: 2022-10-12 | Discharge: 2022-10-12 | Disposition: A | Discharge status: Home / Self Care | Payer: No Typology Code available for payment source | Attending: Family Medicine | Admitting: Family Medicine

## 2022-10-12 DIAGNOSIS — Z23 Encounter for immunization: Secondary | ICD-10-CM

## 2022-10-12 DIAGNOSIS — S61215A Laceration without foreign body of left ring finger without damage to nail, initial encounter: Secondary | ICD-10-CM | POA: Diagnosis not present

## 2022-10-12 MED ORDER — TETANUS-DIPHTH-ACELL PERTUSSIS 5-2.5-18.5 LF-MCG/0.5 IM SUSY
0.5000 mL | PREFILLED_SYRINGE | Freq: Once | INTRAMUSCULAR | Status: AC
  Administered 2022-10-12: 0.5 mL via INTRAMUSCULAR

## 2022-10-12 MED ORDER — MUPIROCIN 2 % EX OINT: 1.0000 | TOPICAL_OINTMENT | Freq: Two times a day (BID) | CUTANEOUS | 0 refills | Status: AC

## 2022-10-12 MED ORDER — LIDOCAINE-EPINEPHRINE-TETRACAINE (LET) TOPICAL GEL
3.0000 mL | Freq: Once | TOPICAL | Status: AC
  Administered 2022-10-12: 3 mL via TOPICAL

## 2022-10-12 MED ORDER — CHLORHEXIDINE GLUCONATE 4 % EX SOLN: Freq: Every day | CUTANEOUS | 0 refills | Status: AC | PRN

## 2022-10-12 NOTE — Discharge Instructions (Addendum)
Once the glue peels itself off after several days, start cleaning the area at least once a day with the Hibiclens solution and apply the mupirocin ointment.  Keep covered with a nonstick dressing at all times until fully healed.  We have also provided a finger splint for protection of the area-you may wear this as long as you feel necessary.  Follow-up for worsening symptoms such as redness, swelling, thick yellow drainage, fever, chills

## 2022-10-12 NOTE — ED Provider Notes (Signed)
[facility]    CSN: [id number] Arrival date & time: 10/12/22  1829      History   Chief Complaint No chief complaint on file.   HPI Derrick Martinez is a 62 y.o. male.   Presenting today with new laceration to the distal left ring finger that occurred about an hour before arrival when he was pulling glass out of a window frame.  Denies loss of range of motion, uncontrolled bleeding, known foreign body.  So far has not been able to clean it with anything, just applying pressure.  Last tetanus shot was in 2015.    Past Medical History:  Diagnosis Date   Arthritis    GERD (gastroesophageal reflux disease)    mild takes nexium every other day   PONV (postoperative nausea and vomiting)    after knee surgery in '93    There are no problems to display for this patient.   Past Surgical History:  Procedure Laterality Date   CERVICAL FUSION     HALLUX VALGUS CHEILECTOMY Right 06/10/2022   Procedure: HALLUX VALGUS CHEILECTOMY;  Surgeon: Erskine Emery, DPM;  Location: [facility];  Service: Podiatry;  Laterality: Right;   KNEE ARTHROSCOPY  01/13/1991   MEDIAL COLLATERAL LIGAMENT REPAIR, ELBOW  12/12/2008   REPAIR EXTENSOR TENDON  01/08/2011   Procedure: REPAIR EXTENSOR TENDON;  Surgeon: Illene Labrador [place];  Location: [facility];  Service: Orthopedics;  Laterality: Right;  RECONSTRUCTION OF COMMON EXTENSOR TENDON WITH PARTIAL EPICONDYLECTOMY OF RIGHT ELBOW   SHOULDER ARTHROSCOPY     TONSILLECTOMY         Home Medications    Prior to Admission medications   Medication Sig Start Date End Date Taking? Authorizing Provider  chlorhexidine (HIBICLENS) 4 % external liquid Apply topically daily as needed. 10/12/22  Yes Particia Nearing, PA-C  mupirocin ointment (BACTROBAN) 2 % Apply 1 Application topically 2 (two) times daily. 10/12/22  Yes Particia Nearing, PA-C  diclofenac (VOLTAREN) 75 MG EC tablet Take 75 mg by mouth daily as needed for  mild pain or moderate pain.    [provider]  esomeprazole (NEXIUM) 40 MG capsule Take 40 mg by mouth every other day.    [provider]  OVER THE COUNTER MEDICATION Take 1 tablet by mouth daily. Relief Factor    [provider]  Polyvinyl Alcohol-Povidone PF (REFRESH) 1.4-0.6 % SOLN Place 1 drop into both eyes at bedtime.    [provider]    Family History Family History  Problem Relation Age of Onset   Diabetes Father     Social History Social History   Tobacco Use   Smoking status: Former    Types: Cigars   Smokeless tobacco: Never  Substance Use Topics   Alcohol use: Yes    Alcohol/week: 14.0 standard drinks of alcohol    Types: 7 Cans of beer, 7 Shots of liquor per week    Comment: occasionally   Drug use: No     Allergies   Patient has no known allergies.   Review of Systems Review of Systems Per HPI  Physical Exam Triage Vital Signs ED Triage Vitals  Encounter Vitals Group     BP 10/12/22 1848 (!) 149/89     Systolic BP Percentile --      Diastolic BP Percentile --      Pulse Rate 10/12/22 1848 90     Resp 10/12/22 1848 16     Temp 10/12/22 1848 98.6 F (  37 C)     Temp Source 10/12/22 1848 Oral     SpO2 10/12/22 1848 96 %     Weight --      Height --      Head Circumference --      Peak Flow --      Pain Score 10/12/22 1847 5     Pain Loc --      Pain Education --      Exclude from Growth Chart --    No data found.  Updated Vital Signs BP (!) 149/89 (BP Location: Right Arm)   Pulse 90   Temp 98.6 F (37 C) (Oral)   Resp 16   SpO2 96%   Visual Acuity Right Eye Distance:   Left Eye Distance:   Bilateral Distance:    Right Eye Near:   Left Eye Near:    Bilateral Near:     Physical Exam Vitals and nursing note reviewed.  Constitutional:      Appearance: Normal appearance.  HENT:     Head: Atraumatic.  Eyes:     Extraocular Movements: Extraocular movements intact.     Conjunctiva/sclera:  Conjunctivae normal.  Cardiovascular:     Rate and Rhythm: Normal rate and regular rhythm.  Pulmonary:     Effort: Pulmonary effort is normal.     Breath sounds: Normal breath sounds.  Musculoskeletal:        General: Tenderness and signs of injury present. No swelling. Normal range of motion.     Cervical back: Normal range of motion and neck supple.  Skin:    General: Skin is warm.     Comments: V-shaped superficial flap type laceration to the distal end of the fourth finger on the left hand.  Well-approximated at rest.  Bleeding well-controlled after let gel.  No foreign body appreciable on thorough exam  Neurological:     General: No focal deficit present.     Mental Status: He is oriented to person, place, and time.     Motor: No weakness.     Gait: Gait normal.     Comments: Left hand neurovascularly intact  Psychiatric:        Mood and Affect: Mood normal.        Thought Content: Thought content normal.        Judgment: Judgment normal.      UC Treatments / Results  Labs (all labs ordered are listed, but only abnormal results are displayed) Labs Reviewed - No data to display  EKG   Radiology No results found.  Procedures Laceration Repair  Date/Time: 10/12/2022 7:26 PM  Performed by: Particia Nearing, PA-C Authorized by: Particia Nearing, PA-C   Consent:    Consent obtained:  Verbal   Consent given by:  Patient   Risks, benefits, and alternatives were discussed: yes     Risks discussed:  Infection, need for additional repair, pain and poor cosmetic result   Alternatives discussed: Sutures versus Dermabond. Universal protocol:    Procedure explained and questions answered to patient or proxy's satisfaction: yes     Relevant documents present and verified: yes     Patient identity confirmed:  Verbally with patient and arm band Anesthesia:    Anesthesia method:  Topical application   Topical anesthetic:  LET Laceration details:    Location:   Finger   Finger location:  L ring finger   Length (cm):  1   Depth (mm):  2 Pre-procedure details:  Preparation:  Patient was prepped and draped in usual sterile fashion Exploration:    Limited defect created (wound extended): no     Hemostasis achieved with:  LET   Wound exploration: wound explored through full range of motion     Contaminated: no   Treatment:    Area cleansed with:  Chlorhexidine   Amount of cleaning:  Standard   Irrigation solution:  Tap water   Irrigation method:  Pressure wash   Visualized foreign bodies/material removed: no     Debridement:  None   Undermining:  None Skin repair:    Repair method:  Tissue adhesive Approximation:    Approximation:  Close Repair type:    Repair type:  Simple Post-procedure details:    Dressing:  Non-adherent dressing and splint for protection   Procedure completion:  Tolerated well, no immediate complications  (including critical care time)  Medications Ordered in UC Medications  lidocaine-EPINEPHrine-tetracaine (LET) topical gel (has no administration in time range)  Tdap (BOOSTRIX) injection 0.5 mL (has no administration in time range)    Initial Impression / Assessment and Plan / UC Course  I have reviewed the triage vital signs and the nursing notes.  Pertinent labs & imaging results that were available during my care of the patient were reviewed by me and considered in my medical decision making (see chart for details).     Wound cleaned thoroughly, Dermabond applied and dressing plus splint placed for protection.  Discussed home wound care with Hibiclens, mupirocin and return precautions.  Tetanus updated.  Final Clinical Impressions(s) / UC Diagnoses   Final diagnoses:  Laceration of left ring finger without foreign body without damage to nail, initial encounter     Discharge Instructions      Once the glue peels itself off after several days, start cleaning the area at least once a day with the  Hibiclens solution and apply the mupirocin ointment.  Keep covered with a nonstick dressing at all times until fully healed.  We have also provided a finger splint for protection of the area-you may wear this as long as you feel necessary.  Follow-up for worsening symptoms such as redness, swelling, thick yellow drainage, fever, chills    ED Prescriptions     Medication Sig Dispense Auth. Provider   mupirocin ointment (BACTROBAN) 2 % Apply 1 Application topically 2 (two) times daily. 22 g Particia Nearing, [place]   chlorhexidine (HIBICLENS) 4 % external liquid Apply topically daily as needed. 236 mL Particia Nearing, [place]      PDMP not reviewed this encounter.   Particia Nearing, [place] 10/12/22 1928

## 2022-10-12 NOTE — ED Triage Notes (Signed)
Pt c/o left ring finger laceration was pulling glass out of a window frame and was down to the last piece, feels like there may be a slither stuck in his finger.

## 2023-11-20 ENCOUNTER — Ambulatory Visit: Admission: EM | Admit: 2023-11-20 | Discharge: 2023-11-20 | Disposition: A | Discharge status: Home / Self Care

## 2023-11-20 DIAGNOSIS — S61431A Puncture wound without foreign body of right hand, initial encounter: Secondary | ICD-10-CM

## 2023-11-20 NOTE — Discharge Instructions (Signed)
Patient declined AVS 

## 2023-11-20 NOTE — ED Provider Notes (Signed)
 [facility]    CSN: [id number] Arrival date & time: 11/20/23  1344      History   Chief Complaint Chief Complaint  Patient presents with   Puncture Wound    HPI Derrick Martinez is a 63 y.o. male.   The history is provided by the patient.   Patient presents after he punctured the right hand in between the index finger and the thumb.  Patient states the incident occurred approximately 2 hours ago.  Patient states that he cannot get the area to stop bleeding, but bleeding is now controlled.  He denies numbness, tingling, swelling, oozing, or drainage from the site.  Patient denies use of blood thinning medications.  Patient received his most recent Tdap in September. Past Medical History:  Diagnosis Date   Arthritis    GERD (gastroesophageal reflux disease)    mild takes nexium every other day   PONV (postoperative nausea and vomiting)    after knee surgery in '93    There are no active problems to display for this patient.   Past Surgical History:  Procedure Laterality Date   CERVICAL FUSION     HALLUX VALGUS CHEILECTOMY Right 06/10/2022   Procedure: HALLUX VALGUS CHEILECTOMY;  Surgeon: Tobie Buckles, DPM;  Location: [facility];  Service: Podiatry;  Laterality: Right;   KNEE ARTHROSCOPY  01/13/1991   MEDIAL COLLATERAL LIGAMENT REPAIR, ELBOW  12/12/2008   REPAIR EXTENSOR TENDON  01/08/2011   Procedure: REPAIR EXTENSOR TENDON;  Surgeon: Lynwood SQUIBB [place];  Location: [facility];  Service: Orthopedics;  Laterality: Right;  RECONSTRUCTION OF COMMON EXTENSOR TENDON WITH PARTIAL EPICONDYLECTOMY OF RIGHT ELBOW   SHOULDER ARTHROSCOPY     TONSILLECTOMY         Home Medications    Prior to Admission medications   Medication Sig Start Date End Date Taking? Authorizing Provider  chlorhexidine  (HIBICLENS ) 4 % external liquid Apply topically daily as needed. 10/12/22   Stuart Vernell Norris, PA-C  diclofenac (VOLTAREN) 75 MG EC tablet Take 75 mg  by mouth daily as needed for mild pain or moderate pain.    [provider]  esomeprazole (NEXIUM) 40 MG capsule Take 40 mg by mouth every other day.    [provider]  mupirocin  ointment (BACTROBAN ) 2 % Apply 1 Application topically 2 (two) times daily. 10/12/22   Stuart Vernell Norris, PA-C  OVER THE COUNTER MEDICATION Take 1 tablet by mouth daily. Relief Factor    [provider]  Polyvinyl Alcohol-Povidone PF (REFRESH) 1.4-0.6 % SOLN Place 1 drop into both eyes at bedtime.    [provider]    Family History Family History  Problem Relation Age of Onset   Diabetes Father     Social History Social History   Tobacco Use   Smoking status: Former    Types: Cigars   Smokeless tobacco: Never  Substance Use Topics   Alcohol use: Yes    Alcohol/week: 14.0 standard drinks of alcohol    Types: 7 Cans of beer, 7 Shots of liquor per week    Comment: occasionally   Drug use: No     Allergies   Patient has no known allergies.   Review of Systems Review of Systems Per HPI  Physical Exam Triage Vital Signs ED Triage Vitals [11/20/23 1428]  Encounter Vitals Group     BP (!) 138/91     Girls Systolic BP Percentile      Girls Diastolic BP Percentile  Boys Systolic BP Percentile      Boys Diastolic BP Percentile      Pulse Rate 69     Resp 20     Temp 98.4 F (36.9 C)     Temp Source Oral     SpO2 97 %     Weight      Height      Head Circumference      Peak Flow      Pain Score 2     Pain Loc      Pain Education      Exclude from Growth Chart    No data found.  Updated Vital Signs BP (!) 138/91 (BP Location: Right Arm)   Pulse 69   Temp 98.4 F (36.9 C) (Oral)   Resp 20   SpO2 97%   Visual Acuity Right Eye Distance:   Left Eye Distance:   Bilateral Distance:    Right Eye Near:   Left Eye Near:    Bilateral Near:     Physical Exam Vitals and nursing note reviewed.  Constitutional:      General: He is not in  acute distress.    Appearance: Normal appearance.  HENT:     Head: Normocephalic.  Eyes:     Extraocular Movements: Extraocular movements intact.     Pupils: Pupils are equal, round, and reactive to light.  Pulmonary:     Effort: Pulmonary effort is normal.  Skin:    General: Skin is warm and dry.     Comments: Puncture wound noted to the right hand in between the right index finger and right thumb.  Bleeding is controlled at this time.  Site was cleansed with Hibiclens  and sterile water.  Band-Aid was applied.  Neurological:     General: No focal deficit present.     Mental Status: He is alert and oriented to person, place, and time.  Psychiatric:        Mood and Affect: Mood normal.        Behavior: Behavior normal.      UC Treatments / Results  Labs (all labs ordered are listed, but only abnormal results are displayed) Labs Reviewed - No data to display  EKG   Radiology No results found.  Procedures Procedures (including critical care time)  Medications Ordered in UC Medications - No data to display  Initial Impression / Assessment and Plan / UC Course  I have reviewed the triage vital signs and the nursing notes.  Pertinent labs & imaging results that were available during my care of the patient were reviewed by me and considered in my medical decision making (see chart for details).  Patient with puncture wound to the right hand.  Site was cleansed, Band-Aid was applied.  Tdap is up-to-date at this time.  Discussed indications with patient regarding follow-up.  Patient was in agreement with this plan of care and verbalizes understanding.  All questions were answered.  Patient stable for discharge.  Final Clinical Impressions(s) / UC Diagnoses   Final diagnoses:  None   Discharge Instructions   None    ED Prescriptions   None    PDMP not reviewed this encounter.   Gilmer Etta PARAS, NP 11/20/23 1440

## 2023-11-20 NOTE — ED Triage Notes (Signed)
 Pt reports he punctured his right hand between his index and thumb with a butcher knife x 2 hrs ago.
